# Patient Record
Sex: Male | Born: 1955 | Race: White | Hispanic: No | Marital: Married | Smoking: Current some day smoker
Health system: Southern US, Community
[De-identification: ages and names within clinical notes are randomized; demographics above are authoritative.]

## PROBLEM LIST (undated history)

## (undated) DIAGNOSIS — Z720 Tobacco use: Secondary | ICD-10-CM

## (undated) DIAGNOSIS — I739 Peripheral vascular disease, unspecified: Secondary | ICD-10-CM

## (undated) DIAGNOSIS — E785 Hyperlipidemia, unspecified: Secondary | ICD-10-CM

## (undated) DIAGNOSIS — E119 Type 2 diabetes mellitus without complications: Secondary | ICD-10-CM

## (undated) DIAGNOSIS — I10 Essential (primary) hypertension: Secondary | ICD-10-CM

## (undated) HISTORY — DX: Type 2 diabetes mellitus without complications: E11.9

## (undated) HISTORY — DX: Peripheral vascular disease, unspecified: I73.9

## (undated) HISTORY — DX: Essential (primary) hypertension: I10

## (undated) HISTORY — DX: Tobacco use: Z72.0

## (undated) HISTORY — DX: Hyperlipidemia, unspecified: E78.5

---

## 1998-05-23 ENCOUNTER — Encounter: Payer: Self-pay | Admitting: Emergency Medicine

## 1998-05-23 ENCOUNTER — Emergency Department (HOSPITAL_COMMUNITY): Admission: EM | Admit: 1998-05-23 | Discharge: 1998-05-23 | Payer: Self-pay | Admitting: Emergency Medicine

## 2000-08-13 ENCOUNTER — Emergency Department (HOSPITAL_COMMUNITY): Admission: EM | Admit: 2000-08-13 | Discharge: 2000-08-13 | Payer: Self-pay | Admitting: Emergency Medicine

## 2002-05-24 ENCOUNTER — Encounter: Admission: RE | Admit: 2002-05-24 | Discharge: 2002-05-24 | Payer: Self-pay | Admitting: Family Medicine

## 2002-08-24 ENCOUNTER — Emergency Department (HOSPITAL_COMMUNITY): Admission: EM | Admit: 2002-08-24 | Discharge: 2002-08-24 | Payer: Self-pay | Admitting: *Deleted

## 2002-09-12 ENCOUNTER — Emergency Department (HOSPITAL_COMMUNITY): Admission: EM | Admit: 2002-09-12 | Discharge: 2002-09-12 | Payer: Self-pay | Admitting: Emergency Medicine

## 2002-09-12 ENCOUNTER — Encounter: Payer: Self-pay | Admitting: Emergency Medicine

## 2002-10-08 ENCOUNTER — Encounter: Admission: RE | Admit: 2002-10-08 | Discharge: 2002-10-08 | Payer: Self-pay | Admitting: Orthopedic Surgery

## 2002-10-08 ENCOUNTER — Encounter: Payer: Self-pay | Admitting: Orthopedic Surgery

## 2006-05-15 DIAGNOSIS — E669 Obesity, unspecified: Secondary | ICD-10-CM

## 2006-05-15 DIAGNOSIS — F17200 Nicotine dependence, unspecified, uncomplicated: Secondary | ICD-10-CM | POA: Insufficient documentation

## 2006-05-19 ENCOUNTER — Encounter: Admission: RE | Admit: 2006-05-19 | Discharge: 2006-05-19 | Payer: Self-pay | Admitting: Nephrology

## 2008-05-02 ENCOUNTER — Emergency Department (HOSPITAL_COMMUNITY): Admission: EM | Admit: 2008-05-02 | Discharge: 2008-05-02 | Payer: Self-pay | Admitting: Emergency Medicine

## 2009-04-17 ENCOUNTER — Emergency Department (HOSPITAL_COMMUNITY): Admission: EM | Admit: 2009-04-17 | Discharge: 2009-04-17 | Payer: Self-pay | Admitting: Emergency Medicine

## 2009-04-19 ENCOUNTER — Emergency Department (HOSPITAL_COMMUNITY): Admission: EM | Admit: 2009-04-19 | Discharge: 2009-04-19 | Payer: Self-pay | Admitting: Emergency Medicine

## 2009-04-25 ENCOUNTER — Encounter: Admission: RE | Admit: 2009-04-25 | Discharge: 2009-04-25 | Payer: Self-pay | Admitting: Family Medicine

## 2009-10-19 ENCOUNTER — Encounter: Admission: RE | Admit: 2009-10-19 | Discharge: 2009-10-19 | Payer: Self-pay | Admitting: Internal Medicine

## 2010-04-07 ENCOUNTER — Other Ambulatory Visit: Payer: Self-pay | Admitting: Internal Medicine

## 2010-04-07 DIAGNOSIS — N289 Disorder of kidney and ureter, unspecified: Secondary | ICD-10-CM

## 2010-06-03 LAB — DIFFERENTIAL
Basophils Absolute: 0 10*3/uL (ref 0.0–0.1)
Basophils Relative: 0 % (ref 0–1)
Eosinophils Absolute: 0 10*3/uL (ref 0.0–0.7)
Neutro Abs: 7.1 10*3/uL (ref 1.7–7.7)
Neutrophils Relative %: 82 % — ABNORMAL HIGH (ref 43–77)

## 2010-06-03 LAB — COMPREHENSIVE METABOLIC PANEL
ALT: 18 U/L (ref 0–53)
Alkaline Phosphatase: 74 U/L (ref 39–117)
BUN: 14 mg/dL (ref 6–23)
CO2: 28 mEq/L (ref 19–32)
Chloride: 100 mEq/L (ref 96–112)
GFR calc non Af Amer: >60 mL/min (ref >60)
Glucose, Bld: 167 mg/dL — ABNORMAL HIGH (ref 70–99)
Potassium: 3.4 mEq/L — ABNORMAL LOW (ref 3.5–5.1)
Sodium: 135 mEq/L (ref 135–145)
Total Bilirubin: 0.8 mg/dL (ref 0.3–1.2)
Total Protein: 7.9 g/dL (ref 6.0–8.3)

## 2010-06-03 LAB — CBC
HCT: 39.6 % (ref 39.0–52.0)
Hemoglobin: 13.2 g/dL (ref 13.0–17.0)
RBC: 4.89 MIL/uL (ref 4.22–5.81)
RDW: 17.4 % — ABNORMAL HIGH (ref 11.5–15.5)

## 2010-06-03 LAB — LIPASE, BLOOD: Lipase: 24 U/L (ref 11–59)

## 2010-06-06 LAB — BASIC METABOLIC PANEL
BUN: 11 mg/dL (ref 6–23)
Calcium: 9 mg/dL (ref 8.4–10.5)
Chloride: 96 mEq/L (ref 96–112)
Creatinine, Ser: 1.05 mg/dL (ref 0.4–1.5)
GFR calc Af Amer: >60 mL/min (ref >60)

## 2010-06-06 LAB — CBC
MCHC: 33 g/dL (ref 30.0–36.0)
MCV: 80.2 fL (ref 78.0–100.0)
Platelets: 309 10*3/uL (ref 150–400)
RBC: 5.02 MIL/uL (ref 4.22–5.81)
WBC: 8.9 10*3/uL (ref 4.0–10.5)

## 2010-06-06 LAB — GLUCOSE, CAPILLARY: Glucose-Capillary: 146 mg/dL — ABNORMAL HIGH (ref 70–99)

## 2010-10-22 ENCOUNTER — Other Ambulatory Visit: Payer: Self-pay

## 2010-10-24 ENCOUNTER — Other Ambulatory Visit: Payer: Self-pay

## 2013-05-12 ENCOUNTER — Ambulatory Visit: Payer: BC Managed Care – PPO | Admitting: Podiatry

## 2013-05-12 ENCOUNTER — Encounter: Payer: Self-pay | Admitting: Podiatry

## 2013-05-12 ENCOUNTER — Ambulatory Visit (INDEPENDENT_AMBULATORY_CARE_PROVIDER_SITE_OTHER): Payer: BC Managed Care – PPO

## 2013-05-12 ENCOUNTER — Other Ambulatory Visit: Payer: Self-pay | Admitting: *Deleted

## 2013-05-12 VITALS — BP 170/96 | HR 66 | Temp 98.5°F | Resp 12

## 2013-05-12 DIAGNOSIS — R52 Pain, unspecified: Secondary | ICD-10-CM

## 2013-05-12 DIAGNOSIS — M779 Enthesopathy, unspecified: Secondary | ICD-10-CM

## 2013-05-12 DIAGNOSIS — L84 Corns and callosities: Secondary | ICD-10-CM

## 2013-05-12 DIAGNOSIS — R0989 Other specified symptoms and signs involving the circulatory and respiratory systems: Secondary | ICD-10-CM

## 2013-05-12 NOTE — Patient Instructions (Signed)
The vascular lab will contact you for your lower extremity arterial Doppler. I have advised to to stay off work until further notice.   Diabetes and Foot Care Diabetes may cause you to have problems because of poor blood supply (circulation) to your feet and legs. This may cause the skin on your feet to become thinner, break easier, and heal more slowly. Your skin may become dry, and the skin may peel and crack. You may also have nerve damage in your legs and feet causing decreased feeling in them. You may not notice minor injuries to your feet that could lead to infections or more serious problems. Taking care of your feet is one of the most important things you can do for yourself.  HOME CARE INSTRUCTIONS  Wear shoes at all times, even in the house. Do not go barefoot. Bare feet are easily injured.  Check your feet daily for blisters, cuts, and redness. If you cannot see the bottom of your feet, use a mirror or ask someone for help.  Wash your feet with warm water (do not use hot water) and mild soap. Then pat your feet and the areas between your toes until they are completely dry. Do not soak your feet as this can dry your skin.  Apply a moisturizing lotion or petroleum jelly (that does not contain alcohol and is unscented) to the skin on your feet and to dry, brittle toenails. Do not apply lotion between your toes.  Trim your toenails straight across. Do not dig under them or around the cuticle. File the edges of your nails with an emery board or nail file.  Do not cut corns or calluses or try to remove them with medicine.  Wear clean socks or stockings every day. Make sure they are not too tight. Do not wear knee-high stockings since they may decrease blood flow to your legs.  Wear shoes that fit properly and have enough cushioning. To break in new shoes, wear them for just a few hours a day. This prevents you from injuring your feet. Always look in your shoes before you put them on to  be sure there are no objects inside.  Do not cross your legs. This may decrease the blood flow to your feet.  If you find a minor scrape, cut, or break in the skin on your feet, keep it and the skin around it clean and dry. These areas may be cleansed with mild soap and water. Do not cleanse the area with peroxide, alcohol, or iodine.  When you remove an adhesive bandage, be sure not to damage the skin around it.  If you have a wound, look at it several times a day to make sure it is healing.  Do not use heating pads or hot water bottles. They may burn your skin. If you have lost feeling in your feet or legs, you may not know it is happening until it is too late.  Make sure your health care provider performs a complete foot exam at least annually or more often if you have foot problems. Report any cuts, sores, or bruises to your health care provider immediately. SEEK MEDICAL CARE IF:   You have an injury that is not healing.  You have cuts or breaks in the skin.  You have an ingrown nail.  You notice redness on your legs or feet.  You feel burning or tingling in your legs or feet.  You have pain or cramps in your legs  and feet.  Your legs or feet are numb.  Your feet always feel cold. SEEK IMMEDIATE MEDICAL CARE IF:   There is increasing redness, swelling, or pain in or around a wound.  There is a red line that goes up your leg.  Pus is coming from a wound.  You develop a fever or as directed by your health care provider.  You notice a bad smell coming from an ulcer or wound. Document Released: 03/01/2000 Document Revised: 11/04/2012 Document Reviewed: 08/11/2012 [facility] Patient Information 2014 [place].

## 2013-05-12 NOTE — Progress Notes (Signed)
   Subjective:    Patient ID: Travis Jensen, male    DOB: [birth year], 58 y.o.   MRN: [id number]  HPI '' BOTH BOTTOM OF THE FEET HAVE CALLUSES  AND THEY BEEN HURTING FOR 3 MONTHS. THE FEET HURTS MORE WHEN PUTTING PRESSURE AND WALKING. MY FEET ARE GETTING WORSE, BUT TRIED TO SOAK IT EPSON SALT AND WARM WATER ONCE A DAY BUT DOES NOT HELP.''  This patient presents for ongoing increasing plantar foot pain left more than right. He is limping when he walks. He is tried some home therapy without improvement of symptoms he works at [employer] and is having difficulty doing his job duties because of painful feet.  Dr. Renford Dillsonald Polite. referred him for evaluation of painful callus on his left foot.  He is a known diabetic and continues to smoke cigarettes.    Review of Systems  Constitutional: Positive for appetite change.  HENT: Positive for sneezing.   Genitourinary: Positive for frequency.  Musculoskeletal: Positive for gait problem.  All other systems reviewed and are negative.       Objective:   Physical Exam  Black male orientated x3 space presents with his wife  Vascular: The right DP 0/4. The left DP 2/4 The PTs are 0/4 bilaterally.  Neurological: Sensation to 10 g monofilament wire intact 10/10 bilaterally. Vibratory sensation intact bilaterally. The knee and ankle reflexes equal and reactive bilaterally. The toenails are elongated, hypertrophic and discolored x10. Plantar left foot demonstrates a fibrous, hemorrhagic callus from the second to fourth MPJ area (20 mm x 40 mm). Gentle debridement of this area reveals hyperkeratotic tissue without any underlying drainage.  Plantar third-fourth MPJ area has a 10 mm nonhemorrhagic,hyperkeratotic tissue,   Musculoskeletal: Low arch foot contour noted, bilaterally. Patient has a limping gait favoring the left foot. 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  X-ray report right foot  Intact bony structure without a  fracture and/or dislocation noted. Mild HAV deformity and hallux interphalangeal is noted. The first metatarsal length is a relatively long.A flatfoot is noted  Radiographic impression: No acute bony abnormality noted in the right foot  X-ray examination weightbearing left foot  A flatfoot is noted. Hallux interphalangeal is noted. There is an x-ray artifact in the distal one third of the fibula.  No fracture or and or dislocations noted.  No acute bony abnormality noted left foot      Assessment & Plan:   Assessment: Rule out peripheral arterial disease because of diminished pedal pulses bilaterally Pre-ulcerative hyperkeratotic lesion plantar left with extreme pain associated with this area Painful hyperkeratoses plantar right Painful gait pattern  Plan: Patient is referred for vascular examination lower extremity arterial Doppler for the indication of diminished pedal pulses and diabetes.  A half-inch Plastizote is placed in a surgical shoe to be worn on the left foot. The plantar keratoses right and left were debrided back without a bleeding Patient will stay out of work until further notice  Reappoint x7 days

## 2013-05-14 ENCOUNTER — Telehealth (HOSPITAL_COMMUNITY): Payer: Self-pay | Admitting: *Deleted

## 2013-05-17 ENCOUNTER — Ambulatory Visit (HOSPITAL_COMMUNITY)
Admission: RE | Admit: 2013-05-17 | Discharge: 2013-05-17 | Disposition: A | Discharge status: Home / Self Care | Payer: BC Managed Care – PPO | Source: Ambulatory Visit | Attending: Internal Medicine | Admitting: Internal Medicine

## 2013-05-17 DIAGNOSIS — I739 Peripheral vascular disease, unspecified: Secondary | ICD-10-CM

## 2013-05-17 DIAGNOSIS — I70219 Atherosclerosis of native arteries of extremities with intermittent claudication, unspecified extremity: Secondary | ICD-10-CM

## 2013-05-17 DIAGNOSIS — I998 Other disorder of circulatory system: Secondary | ICD-10-CM | POA: Insufficient documentation

## 2013-05-17 DIAGNOSIS — R0989 Other specified symptoms and signs involving the circulatory and respiratory systems: Secondary | ICD-10-CM

## 2013-05-17 DIAGNOSIS — L97509 Non-pressure chronic ulcer of other part of unspecified foot with unspecified severity: Secondary | ICD-10-CM

## 2013-05-17 NOTE — Progress Notes (Signed)
Arterial Lower Ext. Duplex Completed. Antiono Ettinger, BS, RDMS, RVT  

## 2013-05-19 ENCOUNTER — Ambulatory Visit: Payer: BC Managed Care – PPO | Admitting: Podiatry

## 2013-05-19 ENCOUNTER — Encounter: Payer: Self-pay | Admitting: Podiatry

## 2013-05-19 VITALS — BP 147/77 | HR 94 | Resp 12

## 2013-05-19 DIAGNOSIS — L97509 Non-pressure chronic ulcer of other part of unspecified foot with unspecified severity: Secondary | ICD-10-CM

## 2013-05-19 NOTE — Patient Instructions (Signed)
Apply triple antibiotic ointment to the skin on the bottom the left foot daily, cover with gauze and attach with Coflex tape. Continue to walk in surgical shoe on  a limited basis.

## 2013-05-20 ENCOUNTER — Telehealth: Payer: Self-pay | Admitting: *Deleted

## 2013-05-20 NOTE — Telephone Encounter (Addendum)
Pt left only his name and phone number.  I called and left message to call again.  I called again at 1109am.  I spoke with pt, he states Dr Leeanne Deeduchman told him to put Triple Antibiotic ointment on his foot where the callous was trimmed, but can he take a shower first?  I told pt to take the shower and cleanse the area as normal and cover with the Triple Antibiotic ointment. Pt states understanding.

## 2013-05-20 NOTE — Progress Notes (Signed)
Patient ID: Travis Jensen, male   DOB: [birth year], 58 y.o.   MRN: [id number]  Subjective: This patient presents for followup care for a painful pre-ulcerative hyperkeratotic lesion plantar aspect left foot under our care since 05/12/2013. He is still having great deal of pain in the plantar aspect of the left foot when walking. He has had a recent arterial Doppler, however the results are not known at this time. Patient is off of work.  Objective: Anti-inflammatory large hyperkeratotic area on the plantar aspect of the left foot MPJ area measures 3.5 cm x 3 cm. After debridement the lesion breakdown into the superficial ulcer without any drainage, malodor, warmth noted.  Nucleated plantar keratoses right noted.  Assessment: Superficial ulceration plantar aspect of left foot, noninfected. Plantar keratoses right.  Pending arterial Doppler  Plan: The plantar ulcer on the left was debrided back and a Silvadene dressing applied. Plantar keratoses right was debrided. Patient was instructed to apply triple antibiotic ointment to the plantar skin of the left foot and cover with gauze, and continue wearing his surgical shoe. Maintain off work status  Reappoint x7 days

## 2013-05-20 NOTE — Telephone Encounter (Signed)
Pt's dopplers of 030/04/2013 were abnormal.  I made an appt for pt with Dr Allyson SabalBerry on 05/27/2013 at 130pm.  I will inform Dr Leeanne Deeduchman.

## 2013-05-25 ENCOUNTER — Encounter: Payer: Self-pay | Admitting: Cardiovascular Disease

## 2013-05-25 ENCOUNTER — Ambulatory Visit (INDEPENDENT_AMBULATORY_CARE_PROVIDER_SITE_OTHER): Payer: BC Managed Care – PPO | Admitting: Cardiovascular Disease

## 2013-05-25 VITALS — BP 132/86 | HR 68 | Ht 73.5 in | Wt 239.3 lb

## 2013-05-25 DIAGNOSIS — E785 Hyperlipidemia, unspecified: Secondary | ICD-10-CM

## 2013-05-25 DIAGNOSIS — E119 Type 2 diabetes mellitus without complications: Secondary | ICD-10-CM | POA: Insufficient documentation

## 2013-05-25 DIAGNOSIS — I739 Peripheral vascular disease, unspecified: Secondary | ICD-10-CM | POA: Insufficient documentation

## 2013-05-25 DIAGNOSIS — I10 Essential (primary) hypertension: Secondary | ICD-10-CM | POA: Insufficient documentation

## 2013-05-25 NOTE — Assessment & Plan Note (Signed)
Controlled on current medications 

## 2013-05-25 NOTE — Progress Notes (Signed)
05/25/2013 Travis StarchLarry D Clermont   05/11/1955  [id number]  Primary Physician Katy ApoPOLITE,Travis D, MD Primary Cardiologist: Runell GessJonathan J. Berry MD Travis RenoFACP,FACC,FAHA, FSCAI   HPI:  [name] is a very pleasant 58 year old moderately overweight married African American male father of 5 children, grandfather of 9 grandchildren who is referred by Dr. Santiago Bumpersichard Tuchman from [facility] for peripheral vascular evaluation because of a slowly healing callus that was shaved on the dorsum of his left foot. His cardiovascular risk factor profile is remarkable for treated diabetes and hyperlipidemia. He quit smoking one month ago and smoked 20 pack years. His mother did die of a myocardial infarction at age 56. He has never had a heart attack or stroke and denies chest pain or shortness of breath. He also denies claudication he had Dopplers in the office that showed ABIs of approximately 0.8 with tibial vessel disease left greater than right. His callus that was shaved by Dr. Leeanne Deeduchman appears to be healing well.   Current Outpatient Prescriptions  Medication Sig Dispense Refill  . atorvastatin (LIPITOR) 10 MG tablet Take 10 mg by mouth daily.       Marland Kitchen. glyBURIDE (DIABETA) 5 MG tablet Take 5 mg by mouth daily.       . metFORMIN (GLUCOPHAGE) 500 MG tablet Take 1,000 mg by mouth 2 (two) times daily with a meal.        No current facility-administered medications for this visit.    No Known Allergies  History   Social History  . Marital Status: Married    Spouse Name: N/A    Number of Children: N/A  . Years of Education: N/A   Occupational History  . Not on file.   Social History Main Topics  . Smoking status: Former Smoker    Quit date: 04/27/2013  . Smokeless tobacco: Not on file  . Alcohol Use: No  . Drug Use: No  . Sexual Activity: Not on file   Other Topics Concern  . Not on file   Social History Narrative  . No narrative on file     Review of Systems: General: negative for chills, fever, night  sweats or weight changes.  Cardiovascular: negative for chest pain, dyspnea on exertion, edema, orthopnea, palpitations, paroxysmal nocturnal dyspnea or shortness of breath Dermatological: negative for rash Respiratory: negative for cough or wheezing Urologic: negative for hematuria Abdominal: negative for nausea, vomiting, diarrhea, bright red blood per rectum, melena, or hematemesis Neurologic: negative for visual changes, syncope, or dizziness All other systems reviewed and are otherwise negative except as noted above.    Blood pressure 132/86, pulse 68, height 6' 1.5" (1.867 m), weight 108.546 kg (239 lb 4.8 oz).  General appearance: alert and no distress Neck: no adenopathy, no carotid bruit, no JVD, supple, symmetrical, trachea midline and thyroid not enlarged, symmetric, no tenderness/mass/nodules Lungs: clear to auscultation bilaterally Heart: regular rate and rhythm, S1, S2 normal, no murmur, click, rub or gallop Abdomen: soft, non-tender; bowel sounds normal; no masses,  no organomegaly Extremities: extremities normal, atraumatic, no cyanosis or edema and aabsent pedal pulses  EKG not performed today  ASSESSMENT AND PLAN:   Peripheral arterial disease Patient was referred by Dr. Santiago Bumpersichard Tuchman [facility] but sent her for peripheral vascular evaluation because of a callous  that was shaved on the dorsum of his left foot. His cardiovascular risk factor profile is remarkable for discontinued tobacco use, hypertension, hyperlipidemia and diabetes. He denies claudication. His lower extremity arterial Doppler studies performed in  our office 05/17/13 revealed ABIs of 0.8 bilaterally with ABIs of 0.4 on the right and 0.6 on the left. He had an occluded dorsalis pedis on the right and a patent peroneal on the left. At this point, given the fact that his talus is healing and he has no symptoms of claudication I am going to continue to follow him medically. His disease is primarily tibial,  typical for diabetics.  Essential hypertension Controlled on current medications  Hyperlipidemia On statin therapy followed by his PCP      Runell Gess MD [facility], [facility] [address] 05/25/2013 8:48 AM

## 2013-05-25 NOTE — Assessment & Plan Note (Signed)
Patient was referred by Dr. Santiago Bumpersichard Tuchman from [facility] but sent her for peripheral vascular evaluation because of a callous  that was shaved on the dorsum of his left foot. His cardiovascular risk factor profile is remarkable for discontinued tobacco use, hypertension, hyperlipidemia and diabetes. He denies claudication. His lower extremity arterial Doppler studies performed in our office 05/17/13 revealed ABIs of 0.8 bilaterally with ABIs of 0.4 on the right and 0.6 on the left. He had an occluded dorsalis pedis on the right and a patent peroneal on the left. At this point, given the fact that his talus is healing and he has no symptoms of claudication I am going to continue to follow him medically. His disease is primarily tibial, typical for diabetics.

## 2013-05-25 NOTE — Assessment & Plan Note (Signed)
On statin therapy followed by his PCP 

## 2013-05-25 NOTE — Patient Instructions (Signed)
Your physician wants you to follow-up in: 6 months with Dr Berry. You will receive a reminder letter in the mail two months in advance. If you don't receive a letter, please call our office to schedule the follow-up appointment.  

## 2013-05-27 ENCOUNTER — Ambulatory Visit: Payer: BC Managed Care – PPO | Admitting: Cardiovascular Disease

## 2013-05-31 ENCOUNTER — Ambulatory Visit (INDEPENDENT_AMBULATORY_CARE_PROVIDER_SITE_OTHER): Payer: BC Managed Care – PPO | Admitting: Podiatry

## 2013-05-31 ENCOUNTER — Encounter: Payer: Self-pay | Admitting: Podiatry

## 2013-05-31 VITALS — BP 125/79 | HR 76 | Temp 98.2°F | Resp 18

## 2013-05-31 DIAGNOSIS — M79609 Pain in unspecified limb: Secondary | ICD-10-CM

## 2013-05-31 DIAGNOSIS — B351 Tinea unguium: Secondary | ICD-10-CM

## 2013-05-31 NOTE — Patient Instructions (Signed)
Wear the surgical shoe on the left foot until you return to work on 06/07/2013. Apply Vaseline it and/or skin lotion daily to the right and left feet.

## 2013-06-01 NOTE — Progress Notes (Signed)
Patient ID: Travis Jensen, male   DOB: [birth year], 58 y.o.   MRN: [id number]   Subjective: This patient presents for followup care for a painful pre-ulcerative hyperkeratotic lesion on the plantar aspect the left foot under care since 05/12/2013. He has been off work since then. An arterial Doppler has been performed and followed up with Dr. Allyson SabalBerry for peripheral arterial disease.  He also is complaining of painful toenails and request debridement.  Objective: The plantar aspect the left foot has some residual hyperkeratotic tissue without any bleeding callus noted in the area. There is no erythema or edema noted in the area.  All 10 toenails are elongated, hypertrophic, discolored and tender to palpation  Assessment: Improving plantar hyperkeratoses with reducing symptoms on the left foot Symptomatic onychomycoses x10 Peripheral arterial disease being followed by cardiologist  Plan: Will allow patient returned to work regular duty I 06/07/2013. He will continue to wear the modified surgical shoe and left foot until he returns to work. He works she will be a good lace up shoe with a soft insole.  All 10 toenails are debrided without any bleeding. Reappoint x4 weeks

## 2013-06-16 ENCOUNTER — Encounter: Payer: Self-pay | Admitting: *Deleted

## 2013-06-16 ENCOUNTER — Encounter: Payer: BC Managed Care – PPO | Attending: Internal Medicine | Admitting: *Deleted

## 2013-06-16 VITALS — Ht 73.0 in | Wt 234.1 lb

## 2013-06-16 DIAGNOSIS — E119 Type 2 diabetes mellitus without complications: Secondary | ICD-10-CM | POA: Insufficient documentation

## 2013-06-16 DIAGNOSIS — Z713 Dietary counseling and surveillance: Secondary | ICD-10-CM | POA: Insufficient documentation

## 2013-06-16 NOTE — Patient Instructions (Signed)
Plan:  Aim for 3-4 Carb servings per meal (45-60 grams) +/- 1 either way  Aim for 0-15 Carbs per snack if hungry  Consider reading food labels for Total Carbohydrate and Fat Grams of foods Consider  increasing your activity level by walking for 30 minutes daily as tolerated . Baby steps Consider checking BG at alternate times per day Fasting and 2 hours after your meal as directed by MD  Continue taking medication  as directed by MD  Consider [facility] Protein Bars Consider margarine substitute ... Brummel and Brown Consider reduced calorie bread Try to limit to 5 egg yolks a week

## 2013-06-16 NOTE — Progress Notes (Signed)
Appt start time: 1500 end time:  1630.   Assessment:  Patient was seen on  06/16/13 for individual diabetes education. He presents with his wife.  [name] was diagnosed with T2DM approximately 10 years ago but did not receive any education. He Was was on Metformin and discontinued for a few years. His glucose then became out of control . He has since seen Dr. Nehemiah SettlePolite, is back on the metformin and has made some dietary modifications.Patient test glucose 4X daily FBS range 104-197mg /dl. Avg 139mg /dl.  Current HbA1c: 12.4%   04/2013  Preferred Learning Style:   No preference indicated   Learning Readiness:   Change in progress  MEDICATIONS: See list: Metformin   DIETARY INTAKE:  B ( AM): 1/2 C grits, 2 egg, water Snk ( AM): none  L ( PM): baked chicken, brown rice, broccoli, carrots , water Snk ( PM): none D ( PM): salad, (spinache, kale, tomatoes, grapes, cheese, cucumbers, raisins, ) ranch dressing Snk ( PM): none Beverages: water, coffee/splenda  Usual physical activity: None, has treadmill and stationary bicycle at home  Intervention:  Nutrition counseling provided.  Discussed diabetes disease process and treatment options.  Discussed physiology of diabetes and role of obesity on insulin resistance.  Encouraged moderate weight reduction to improve glucose levels.  Discussed role of medications and diet in glucose control  Provided education on macronutrients on glucose levels.  Provided education on carb counting, importance of regularly scheduled meals/snacks, and meal planning  Discussed effects of physical activity on glucose levels and long-term glucose control.  Recommended 150 minutes of physical activity/week.  Reviewed patient medications.  Discussed role of medication on blood glucose and possible side effects  Discussed blood glucose monitoring and interpretation.  Discussed recommended target ranges and individual ranges.    Described short-term complications: hyper-  and hypo-glycemia.  Discussed causes,symptoms, and treatment options.  Discussed prevention, detection, and treatment of long-term complications.  Discussed the role of prolonged elevated glucose levels on body systems.  Discussed role of stress on blood glucose levels and discussed strategies to manage psychosocial issues.  Discussed recommendations for long-term diabetes self-care.  Established checklist for medical, dental, and emotional self-care.  Plan:  Aim for 3-4 Carb servings per meal (45-60 grams) +/- 1 either way  Aim for 0-15 Carbs per snack if hungry  Consider reading food labels for Total Carbohydrate and Fat Grams of foods Consider  increasing your activity level by walking for 30 minutes daily as tolerated . Baby steps Consider checking BG at alternate times per day Fasting and 2 hours after your meal as directed by MD  Continue taking medication  as directed by MD  Consider [facility] Protein Bars Consider margarine substitute ... Brummel and [employer] Consider reduced calorie bread Try to limit to 5 egg yolks a week  Teaching Method Utilized:  Visual Auditory Hands on  Handouts given during visit include: Living Well with Diabetes Carb Counting and Food Label handouts Meal Plan Card My Plate  Snack sheet  Barriers to learning/adherence to lifestyle change: None  Diabetes self-care support plan:   [facility] support group  Family  Demonstrated degree of understanding via:  Teach Back   Monitoring/Evaluation:  Dietary intake, exercise, test glucose, and body weight return for f/u prn.

## 2013-06-30 ENCOUNTER — Ambulatory Visit (INDEPENDENT_AMBULATORY_CARE_PROVIDER_SITE_OTHER): Payer: BC Managed Care – PPO | Admitting: Podiatry

## 2013-06-30 ENCOUNTER — Encounter: Payer: Self-pay | Admitting: Podiatry

## 2013-06-30 VITALS — BP 205/121 | HR 76 | Temp 98.4°F | Resp 18 | Ht 73.5 in | Wt 234.0 lb

## 2013-06-30 DIAGNOSIS — L84 Corns and callosities: Secondary | ICD-10-CM

## 2013-06-30 NOTE — Patient Instructions (Signed)
Wear over-the-counter soft insole inside a new lace up style shoe

## 2013-07-01 NOTE — Progress Notes (Signed)
Patient ID: Travis Jensen, male   DOB: January 08, 1956, 58 y.o.   MRN: [id number] Subjective: This patient presents for followup care for painful pre-ulcerative hyperkeratotic lesion plantar left foot under care since 05/12/2013. He has been evaluated for peripheral arterial disease and is being followed by Dr. Gery PrayBarry for this. He has returned to his job at [employer].  Objective: Large diffuse hyperkeratotic lesion plantar left from second to fourth MPJ area with reactive fibrous tissue around keratoses.  Assessment: Pre-ulcerative hyperkeratotic lesion plantar left Peripheral arterial disease  Plan: The plantar Keratoses is debrided. Patient is advised to wear a soft over-the-counter insole in a new shoe. 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  Reappoint x4 weeks

## 2013-07-28 ENCOUNTER — Ambulatory Visit (INDEPENDENT_AMBULATORY_CARE_PROVIDER_SITE_OTHER): Payer: BC Managed Care – PPO | Admitting: Podiatry

## 2013-07-28 ENCOUNTER — Encounter: Payer: Self-pay | Admitting: Podiatry

## 2013-07-28 VITALS — BP 181/96 | HR 82 | Temp 98.1°F | Resp 18 | Ht 73.5 in | Wt 233.0 lb

## 2013-07-28 DIAGNOSIS — L84 Corns and callosities: Secondary | ICD-10-CM

## 2013-07-29 NOTE — Progress Notes (Signed)
Patient ID: Travis Jensen, male   DOB: 12/29/1955, 58 y.o.   MRN: [id number]  Subjective: This patient presents for followup care for painful pre-ulcerative hyperkeratotic lesion under care since 05/12/2013. Has been evaluated for peripheral arterial disease by Dr. York RamJonathan Barry and he will continue followup care by Dr. Gery PrayBarry. 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  Objective: Large diffuse hyperkeratotic lesion plantar second to fourth MPJ that after debridement remains closed. Area demonstrates fibrous reactive tissue without any hemorrhagic callus noted  Assessment: Hyperkeratotic tissue plantar left foot  Plan: Debridement of hyperkeratotic tissue Attach felt pad to patient's existing shoe insole to offload the weight off the plantar second and fourth MPJ area on the left foot.  Reappoint x6 weeks

## 2013-08-19 ENCOUNTER — Other Ambulatory Visit (HOSPITAL_COMMUNITY): Payer: Self-pay | Admitting: Internal Medicine

## 2013-08-19 ENCOUNTER — Other Ambulatory Visit: Payer: Self-pay | Admitting: Internal Medicine

## 2013-08-19 ENCOUNTER — Ambulatory Visit
Admission: RE | Admit: 2013-08-19 | Discharge: 2013-08-19 | Disposition: A | Discharge status: Home / Self Care | Payer: BC Managed Care – PPO | Source: Ambulatory Visit | Attending: Internal Medicine | Admitting: Internal Medicine

## 2013-08-19 DIAGNOSIS — R112 Nausea with vomiting, unspecified: Secondary | ICD-10-CM

## 2013-08-20 ENCOUNTER — Ambulatory Visit (HOSPITAL_COMMUNITY)
Admission: RE | Admit: 2013-08-20 | Discharge: 2013-08-20 | Disposition: A | Discharge status: Home / Self Care | Payer: BC Managed Care – PPO | Source: Ambulatory Visit | Attending: Internal Medicine | Admitting: Internal Medicine

## 2013-08-20 DIAGNOSIS — R109 Unspecified abdominal pain: Secondary | ICD-10-CM | POA: Insufficient documentation

## 2013-08-20 DIAGNOSIS — R52 Pain, unspecified: Secondary | ICD-10-CM | POA: Insufficient documentation

## 2013-08-20 DIAGNOSIS — R112 Nausea with vomiting, unspecified: Secondary | ICD-10-CM | POA: Insufficient documentation

## 2013-08-30 ENCOUNTER — Other Ambulatory Visit: Payer: Self-pay | Admitting: Internal Medicine

## 2013-08-30 DIAGNOSIS — N289 Disorder of kidney and ureter, unspecified: Secondary | ICD-10-CM

## 2013-09-05 ENCOUNTER — Ambulatory Visit
Admission: RE | Admit: 2013-09-05 | Discharge: 2013-09-05 | Disposition: A | Discharge status: Home / Self Care | Payer: BC Managed Care – PPO | Source: Ambulatory Visit | Attending: Internal Medicine | Admitting: Internal Medicine

## 2013-09-05 DIAGNOSIS — N289 Disorder of kidney and ureter, unspecified: Secondary | ICD-10-CM

## 2013-09-05 MED ORDER — GADOBENATE DIMEGLUMINE 529 MG/ML IV SOLN
10.0000 mL | Freq: Once | INTRAVENOUS | Status: AC | PRN
  Administered 2013-09-05: 10 mL via INTRAVENOUS

## 2013-09-08 ENCOUNTER — Ambulatory Visit (INDEPENDENT_AMBULATORY_CARE_PROVIDER_SITE_OTHER): Payer: BC Managed Care – PPO | Admitting: Podiatry

## 2013-09-08 ENCOUNTER — Ambulatory Visit: Payer: BC Managed Care – PPO | Admitting: Podiatry

## 2013-09-08 ENCOUNTER — Encounter: Payer: Self-pay | Admitting: Podiatry

## 2013-09-08 VITALS — BP 135/86 | HR 68 | Temp 98.0°F | Resp 16 | Ht 73.5 in | Wt 226.0 lb

## 2013-09-08 DIAGNOSIS — L84 Corns and callosities: Secondary | ICD-10-CM

## 2013-09-08 NOTE — Progress Notes (Signed)
Patient ID: Travis Jensen, male   DOB: [birth year], 58 y.o.   MRN: [id number]  Subjective: This patient presents for followup care for pre-ulcerative hyperkeratotic lesion under care since every 25th 2015. Patient has been evaluated for peripheral arterial disease by Dr. Nanetta BattyJonathan Berry and he will continue followup with Dr. Allyson SabalBerry.  Objective: Diffuse hemorrhagic keratoses plantar second to fourth MPJ left and after debridement radiate remains closed. Hyperkeratotic tissue plantar right without bleeding  Assessment: Pre-ulcerative hyperkeratotic tissue left chronic Hyperkeratotic tissue right  Plan: Debridement of keratoses both right and left feet.  Reappoint x6 weeks for  debridement

## 2013-09-20 ENCOUNTER — Ambulatory Visit: Payer: BC Managed Care – PPO | Admitting: Podiatry

## 2013-10-20 ENCOUNTER — Ambulatory Visit (INDEPENDENT_AMBULATORY_CARE_PROVIDER_SITE_OTHER): Payer: BC Managed Care – PPO | Admitting: Podiatry

## 2013-10-20 ENCOUNTER — Encounter: Payer: Self-pay | Admitting: Podiatry

## 2013-10-20 VITALS — BP 174/86 | HR 72 | Resp 12

## 2013-10-20 DIAGNOSIS — L84 Corns and callosities: Secondary | ICD-10-CM

## 2013-10-20 DIAGNOSIS — I739 Peripheral vascular disease, unspecified: Secondary | ICD-10-CM

## 2013-10-21 NOTE — Progress Notes (Signed)
Patient ID: Luvenia StarchLarry D Badger, male   DOB: Mar 10, 1956, 58 y.o.   MRN: [id number]  Subjective: This patient presents for followup care for repetitive debridement of pre-ulcerative hyperkeratotic lesion plantar left and a newer lesion plantar right. He is complaining of painful plantar callus left and is requesting more of frequent debridement. Peripheral arterial disease is under the management of Dr. Nanetta BattyJonathan Berry.  Objective: Diffuse hemorrhagic keratoses plantar second the fourth MPJ and remains closed after debridement Plantar keratoses right Toenails are elongated, hypertrophic, incurvated 6-10  Assessment: Pre-ulcerative hyperkeratotic lesion left Plantar keratoses right Peripheral arterial disease bilaterally Diabetes  Plan: Keratoses right and left were debrided.  Reappoint x4 weeks for debridement of keratoses and nails at next visit

## 2013-11-17 ENCOUNTER — Encounter: Payer: Self-pay | Admitting: Podiatry

## 2013-11-17 ENCOUNTER — Ambulatory Visit (INDEPENDENT_AMBULATORY_CARE_PROVIDER_SITE_OTHER): Payer: BC Managed Care – PPO | Admitting: Podiatry

## 2013-11-17 VITALS — BP 154/90 | HR 87 | Resp 15

## 2013-11-17 DIAGNOSIS — I739 Peripheral vascular disease, unspecified: Secondary | ICD-10-CM

## 2013-11-17 DIAGNOSIS — B351 Tinea unguium: Secondary | ICD-10-CM

## 2013-11-17 DIAGNOSIS — M79676 Pain in unspecified toe(s): Secondary | ICD-10-CM

## 2013-11-17 DIAGNOSIS — M79609 Pain in unspecified limb: Secondary | ICD-10-CM

## 2013-11-17 DIAGNOSIS — L84 Corns and callosities: Secondary | ICD-10-CM

## 2013-11-18 NOTE — Progress Notes (Signed)
Patient ID: Travis Jensen, male   DOB: 08-25-1955, 58 y.o.   MRN: [id number]  Subjective: This patient presents for ongoing debridement of pre-ulcerative hyperkeratotic tissue and is also complaining of painful toenails and requesting debridement of toenails today as well.  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

## 2013-12-15 ENCOUNTER — Ambulatory Visit (INDEPENDENT_AMBULATORY_CARE_PROVIDER_SITE_OTHER): Payer: BC Managed Care – PPO | Admitting: Podiatry

## 2013-12-15 ENCOUNTER — Encounter: Payer: Self-pay | Admitting: Podiatry

## 2013-12-15 VITALS — BP 119/74 | HR 90 | Resp 12

## 2013-12-15 DIAGNOSIS — I739 Peripheral vascular disease, unspecified: Secondary | ICD-10-CM

## 2013-12-15 DIAGNOSIS — L84 Corns and callosities: Secondary | ICD-10-CM

## 2013-12-16 NOTE — Progress Notes (Signed)
Patient ID: Travis StarchLarry D Villasenor, male   DOB: 11-12-1955, 58 y.o.   MRN: [id number] Subjective: This patient presents for ongoing debridement of pre-ulcerative Hyperkeratotic tissue plantar left associated with peripheral arterial disease and diabetes. 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  Objective: Hemorrhagic callus plantar left MPJ that remains closed after debridement  Assessment:  pre-ulcerative plantar callus left Diabetes with peripheral arterial disease Dr. Allyson SabalBerry is managing peripheral arterial disease  Plan: Plantar calluses debrided A metatarsal raise was attached to the soft insole patient's shoe on the left foot  Reappoint x1 month at patient's request

## 2014-01-12 ENCOUNTER — Encounter: Payer: Self-pay | Admitting: Podiatry

## 2014-01-12 ENCOUNTER — Ambulatory Visit (INDEPENDENT_AMBULATORY_CARE_PROVIDER_SITE_OTHER): Payer: BC Managed Care – PPO | Admitting: Podiatry

## 2014-01-12 VITALS — BP 128/78 | HR 60 | Resp 12

## 2014-01-12 DIAGNOSIS — I739 Peripheral vascular disease, unspecified: Secondary | ICD-10-CM

## 2014-01-12 DIAGNOSIS — L84 Corns and callosities: Secondary | ICD-10-CM

## 2014-01-12 NOTE — Progress Notes (Signed)
Patient ID: Travis StarchLarry D Jensen, male   DOB: 05-22-1955, 58 y.o.   MRN: [id number]  Subjective: This patient presents for ongoing debridement of pre-ulcerative hyperkeratoses plantar left.  He continues to work at [employer] requiring continuous standing and walking  Objective: Orientated 3 Large hemorrhagic plantar keratoses second to fourth MPJ left and remains closed after debridement  Assessment: Pre-ulcerative hyperkeratoses plantar left He is a type II diabetic with peripheral arterial diseasePeripheral arterial disease under management of Dr.Berry  Plan: Debridement plantar keratoses left Attach surgical felt pad with cut out to offload the second to fourth MPJ and placed on shoe insole, left  Report 30 days

## 2014-02-09 ENCOUNTER — Ambulatory Visit: Payer: BC Managed Care – PPO | Admitting: Podiatry

## 2014-04-13 ENCOUNTER — Ambulatory Visit (INDEPENDENT_AMBULATORY_CARE_PROVIDER_SITE_OTHER): Payer: BLUE CROSS/BLUE SHIELD | Admitting: Podiatry

## 2014-04-13 DIAGNOSIS — M79676 Pain in unspecified toe(s): Secondary | ICD-10-CM

## 2014-04-13 DIAGNOSIS — B351 Tinea unguium: Secondary | ICD-10-CM

## 2014-04-13 DIAGNOSIS — L84 Corns and callosities: Secondary | ICD-10-CM

## 2014-04-13 DIAGNOSIS — I739 Peripheral vascular disease, unspecified: Secondary | ICD-10-CM

## 2014-04-13 NOTE — Progress Notes (Signed)
Patient ID: Travis Jensen, male   DOB: 1955/09/17, 59 y.o.   MRN: [id number]  Subjective: This patient presents today for ongoing debridement of pre-ulcerative plantar keratoses left and complaining of painful toenails right and left Has a weightbearing retail job  Objective: Large hemorrhagic hyperkeratoses second to fourth MPJ left that remains closed after debridement The toenails are elongated, incurvated discolored 6-10  Assessment: Type II diabetic with a history of peripheral arterial disease Onychomycoses 6-10 Pre- ulcerative callus plantar left  Plan: Debridement toenails 10 and keratoses 1 without a bleeding  Reappoint at 6 six-week intervals

## 2014-04-13 NOTE — Patient Instructions (Signed)
Diabetes and Foot Care Diabetes may cause you to have problems because of poor blood supply (circulation) to your feet and legs. This may cause the skin on your feet to become thinner, break easier, and heal more slowly. Your skin may become dry, and the skin may peel and crack. You may also have nerve damage in your legs and feet causing decreased feeling in them. You may not notice minor injuries to your feet that could lead to infections or more serious problems. Taking care of your feet is one of the most important things you can do for yourself.  HOME CARE INSTRUCTIONS  Wear shoes at all times, even in the house. Do not go barefoot. Bare feet are easily injured.  Check your feet daily for blisters, cuts, and redness. If you cannot see the bottom of your feet, use a mirror or ask someone for help.  Wash your feet with warm water (do not use hot water) and mild soap. Then pat your feet and the areas between your toes until they are completely dry. Do not soak your feet as this can dry your skin.  Apply a moisturizing lotion or petroleum jelly (that does not contain alcohol and is unscented) to the skin on your feet and to dry, brittle toenails. Do not apply lotion between your toes.  Trim your toenails straight across. Do not dig under them or around the cuticle. File the edges of your nails with an emery board or nail file.  Do not cut corns or calluses or try to remove them with medicine.  Wear clean socks or stockings every day. Make sure they are not too tight. Do not wear knee-high stockings since they may decrease blood flow to your legs.  Wear shoes that fit properly and have enough cushioning. To break in new shoes, wear them for just a few hours a day. This prevents you from injuring your feet. Always look in your shoes before you put them on to be sure there are no objects inside.  Do not cross your legs. This may decrease the blood flow to your feet.  If you find a minor scrape,  cut, or break in the skin on your feet, keep it and the skin around it clean and dry. These areas may be cleansed with mild soap and water. Do not cleanse the area with peroxide, alcohol, or iodine.  When you remove an adhesive bandage, be sure not to damage the skin around it.  If you have a wound, look at it several times a day to make sure it is healing.  Do not use heating pads or hot water bottles. They may burn your skin. If you have lost feeling in your feet or legs, you may not know it is happening until it is too late.  Make sure your health care provider performs a complete foot exam at least annually or more often if you have foot problems. Report any cuts, sores, or bruises to your health care provider immediately. SEEK MEDICAL CARE IF:   You have an injury that is not healing.  You have cuts or breaks in the skin.  You have an ingrown nail.  You notice redness on your legs or feet.  You feel burning or tingling in your legs or feet.  You have pain or cramps in your legs and feet.  Your legs or feet are numb.  Your feet always feel cold. SEEK IMMEDIATE MEDICAL CARE IF:   There is increasing redness,   swelling, or pain in or around a wound.  There is a red line that goes up your leg.  Pus is coming from a wound.  You develop a fever or as directed by your health care provider.  You notice a bad smell coming from an ulcer or wound. Document Released: 03/01/2000 Document Revised: 11/04/2012 Document Reviewed: 08/11/2012 [employer] Patient Information 2015 [employer]. This information is not intended to replace advice given to you by your health care provider. Make sure you discuss any questions you have with your health care provider.  

## 2014-05-25 ENCOUNTER — Ambulatory Visit: Payer: BLUE CROSS/BLUE SHIELD | Admitting: Podiatry

## 2014-06-01 ENCOUNTER — Ambulatory Visit (INDEPENDENT_AMBULATORY_CARE_PROVIDER_SITE_OTHER): Payer: BLUE CROSS/BLUE SHIELD | Admitting: Podiatry

## 2014-06-01 ENCOUNTER — Encounter: Payer: Self-pay | Admitting: Podiatry

## 2014-06-01 VITALS — BP 178/92 | HR 64 | Resp 12

## 2014-06-01 DIAGNOSIS — I739 Peripheral vascular disease, unspecified: Secondary | ICD-10-CM | POA: Diagnosis not present

## 2014-06-01 DIAGNOSIS — L84 Corns and callosities: Secondary | ICD-10-CM

## 2014-06-02 NOTE — Progress Notes (Signed)
Patient ID: Travis StarchLarry D Jensen, male   DOB: April 05, 1955, 59 y.o.   MRN: [id number]  Subjective: This patient presents for ongoing debridement of pre-ulcerative plantar callus on the left foot. 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  Objective: Well-organized large hemorrhagic hyperkeratoses second to fourth MPJ that remains closed after debridement  Assessment: Pre-ulcerative plantar keratoses left Type II diabetic with a history of peripheral arterial disease  Plan: Debridement of pre-ulcerative keratoses without a bleeding Patient will continue to wear soft insole inside a athletic style work shoe  Reappoint 4 weeks

## 2014-06-08 ENCOUNTER — Ambulatory Visit: Payer: BLUE CROSS/BLUE SHIELD | Admitting: Podiatry

## 2014-06-29 ENCOUNTER — Ambulatory Visit (INDEPENDENT_AMBULATORY_CARE_PROVIDER_SITE_OTHER): Payer: BLUE CROSS/BLUE SHIELD | Admitting: Podiatry

## 2014-06-29 ENCOUNTER — Encounter: Payer: Self-pay | Admitting: Podiatry

## 2014-06-29 VITALS — BP 156/72 | HR 67 | Resp 16

## 2014-06-29 DIAGNOSIS — I739 Peripheral vascular disease, unspecified: Secondary | ICD-10-CM | POA: Diagnosis not present

## 2014-06-29 DIAGNOSIS — L84 Corns and callosities: Secondary | ICD-10-CM | POA: Diagnosis not present

## 2014-06-29 NOTE — Progress Notes (Signed)
Patient ID: Travis StarchLarry D Greaser, male   DOB: 1955-04-06, 59 y.o.   MRN: [id number]  Subjective: This patient presents for ongoing debridement of pre-ulcerative plantar callus on the left foot. 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 There is no surrounding erythema, edema or active drainage  Assessment: Diabetic with a history of peripheral arterial disease Pre-ulcerative plantar callus left  Plan: Debridement of plantar callus left Continue to wear athletic style shoes with soft insoles  Reappoint 4 weeks

## 2014-06-29 NOTE — Patient Instructions (Signed)
Diabetes and Foot Care Diabetes may cause you to have problems because of poor blood supply (circulation) to your feet and legs. This may cause the skin on your feet to become thinner, break easier, and heal more slowly. Your skin may become dry, and the skin may peel and crack. You may also have nerve damage in your legs and feet causing decreased feeling in them. You may not notice minor injuries to your feet that could lead to infections or more serious problems. Taking care of your feet is one of the most important things you can do for yourself.  HOME CARE INSTRUCTIONS  Wear shoes at all times, even in the house. Do not go barefoot. Bare feet are easily injured.  Check your feet daily for blisters, cuts, and redness. If you cannot see the bottom of your feet, use a mirror or ask someone for help.  Wash your feet with warm water (do not use hot water) and mild soap. Then pat your feet and the areas between your toes until they are completely dry. Do not soak your feet as this can dry your skin.  Apply a moisturizing lotion or petroleum jelly (that does not contain alcohol and is unscented) to the skin on your feet and to dry, brittle toenails. Do not apply lotion between your toes.  Trim your toenails straight across. Do not dig under them or around the cuticle. File the edges of your nails with an emery board or nail file.  Do not cut corns or calluses or try to remove them with medicine.  Wear clean socks or stockings every day. Make sure they are not too tight. Do not wear knee-high stockings since they may decrease blood flow to your legs.  Wear shoes that fit properly and have enough cushioning. To break in new shoes, wear them for just a few hours a day. This prevents you from injuring your feet. Always look in your shoes before you put them on to be sure there are no objects inside.  Do not cross your legs. This may decrease the blood flow to your feet.  If you find a minor scrape,  cut, or break in the skin on your feet, keep it and the skin around it clean and dry. These areas may be cleansed with mild soap and water. Do not cleanse the area with peroxide, alcohol, or iodine.  When you remove an adhesive bandage, be sure not to damage the skin around it.  If you have a wound, look at it several times a day to make sure it is healing.  Do not use heating pads or hot water bottles. They may burn your skin. If you have lost feeling in your feet or legs, you may not know it is happening until it is too late.  Make sure your health care provider performs a complete foot exam at least annually or more often if you have foot problems. Report any cuts, sores, or bruises to your health care provider immediately. SEEK MEDICAL CARE IF:   You have an injury that is not healing.  You have cuts or breaks in the skin.  You have an ingrown nail.  You notice redness on your legs or feet.  You feel burning or tingling in your legs or feet.  You have pain or cramps in your legs and feet.  Your legs or feet are numb.  Your feet always feel cold. SEEK IMMEDIATE MEDICAL CARE IF:   There is increasing redness,   swelling, or pain in or around a wound.  There is a red line that goes up your leg.  Pus is coming from a wound.  You develop a fever or as directed by your health care provider.  You notice a bad smell coming from an ulcer or wound. Document Released: 03/01/2000 Document Revised: 11/04/2012 Document Reviewed: 08/11/2012 [employer] Patient Information 2015 [employer]. This information is not intended to replace advice given to you by your health care provider. Make sure you discuss any questions you have with your health care provider.  

## 2014-07-27 ENCOUNTER — Ambulatory Visit (INDEPENDENT_AMBULATORY_CARE_PROVIDER_SITE_OTHER): Payer: BLUE CROSS/BLUE SHIELD | Admitting: Podiatry

## 2014-07-27 ENCOUNTER — Encounter: Payer: Self-pay | Admitting: Podiatry

## 2014-07-27 DIAGNOSIS — L84 Corns and callosities: Secondary | ICD-10-CM

## 2014-07-27 DIAGNOSIS — I739 Peripheral vascular disease, unspecified: Secondary | ICD-10-CM

## 2014-07-27 DIAGNOSIS — B351 Tinea unguium: Secondary | ICD-10-CM | POA: Diagnosis not present

## 2014-07-27 NOTE — Patient Instructions (Signed)
Diabetes and Foot Care Diabetes may cause you to have problems because of poor blood supply (circulation) to your feet and legs. This may cause the skin on your feet to become thinner, break easier, and heal more slowly. Your skin may become dry, and the skin may peel and crack. You may also have nerve damage in your legs and feet causing decreased feeling in them. You may not notice minor injuries to your feet that could lead to infections or more serious problems. Taking care of your feet is one of the most important things you can do for yourself.  HOME CARE INSTRUCTIONS  Wear shoes at all times, even in the house. Do not go barefoot. Bare feet are easily injured.  Check your feet daily for blisters, cuts, and redness. If you cannot see the bottom of your feet, use a mirror or ask someone for help.  Wash your feet with warm water (do not use hot water) and mild soap. Then pat your feet and the areas between your toes until they are completely dry. Do not soak your feet as this can dry your skin.  Apply a moisturizing lotion or petroleum jelly (that does not contain alcohol and is unscented) to the skin on your feet and to dry, brittle toenails. Do not apply lotion between your toes.  Trim your toenails straight across. Do not dig under them or around the cuticle. File the edges of your nails with an emery board or nail file.  Do not cut corns or calluses or try to remove them with medicine.  Wear clean socks or stockings every day. Make sure they are not too tight. Do not wear knee-high stockings since they may decrease blood flow to your legs.  Wear shoes that fit properly and have enough cushioning. To break in new shoes, wear them for just a few hours a day. This prevents you from injuring your feet. Always look in your shoes before you put them on to be sure there are no objects inside.  Do not cross your legs. This may decrease the blood flow to your feet.  If you find a minor scrape,  cut, or break in the skin on your feet, keep it and the skin around it clean and dry. These areas may be cleansed with mild soap and water. Do not cleanse the area with peroxide, alcohol, or iodine.  When you remove an adhesive bandage, be sure not to damage the skin around it.  If you have a wound, look at it several times a day to make sure it is healing.  Do not use heating pads or hot water bottles. They may burn your skin. If you have lost feeling in your feet or legs, you may not know it is happening until it is too late.  Make sure your health care provider performs a complete foot exam at least annually or more often if you have foot problems. Report any cuts, sores, or bruises to your health care provider immediately. SEEK MEDICAL CARE IF:   You have an injury that is not healing.  You have cuts or breaks in the skin.  You have an ingrown nail.  You notice redness on your legs or feet.  You feel burning or tingling in your legs or feet.  You have pain or cramps in your legs and feet.  Your legs or feet are numb.  Your feet always feel cold. SEEK IMMEDIATE MEDICAL CARE IF:   There is increasing redness,   swelling, or pain in or around a wound.  There is a red line that goes up your leg.  Pus is coming from a wound.  You develop a fever or as directed by your health care provider.  You notice a bad smell coming from an ulcer or wound. Document Released: 03/01/2000 Document Revised: 11/04/2012 Document Reviewed: 08/11/2012 [employer] Patient Information 2015 [employer]. This information is not intended to replace advice given to you by your health care provider. Make sure you discuss any questions you have with your health care provider.  

## 2014-07-29 NOTE — Progress Notes (Signed)
Patient ID: Travis StarchLarry D Brinley, male   DOB: 11-25-1955, 59 y.o.   MRN: [id number]  Subjective: This patient presents for ongoing debridement of pre-ulcerative plantar callus in the left foot at approximately 4 week intervals. 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 In addition patient also is complaining of uncomfortable toenails  Objective: Large circular hemorrhagic plantar keratoses trailing second to fourth left MPJ without any surrounding erythema, edema, warmth or active drainage. There is no bleeding after debridement The toenails are elongated, incurvated, discolored 6-10  Assessment: Diabetic with a history of peripheral arterial disease Pre-ulcerative plantar callus left Mycotic toenails 6-10  Plan: Debride plantar keratoses 1 and  nails 10 without any bleeding  Patient will continue wearing athletic style shoes with soft insoles  Reappoint 4 weeks

## 2014-08-24 ENCOUNTER — Encounter: Payer: Self-pay | Admitting: Podiatry

## 2014-08-24 ENCOUNTER — Ambulatory Visit (INDEPENDENT_AMBULATORY_CARE_PROVIDER_SITE_OTHER): Payer: BLUE CROSS/BLUE SHIELD | Admitting: Podiatry

## 2014-08-24 VITALS — Temp 97.7°F | Resp 14

## 2014-08-24 DIAGNOSIS — L84 Corns and callosities: Secondary | ICD-10-CM

## 2014-08-24 DIAGNOSIS — E1151 Type 2 diabetes mellitus with diabetic peripheral angiopathy without gangrene: Secondary | ICD-10-CM

## 2014-08-24 NOTE — Patient Instructions (Signed)
Diabetes and Foot Care Diabetes may cause you to have problems because of poor blood supply (circulation) to your feet and legs. This may cause the skin on your feet to become thinner, break easier, and heal more slowly. Your skin may become dry, and the skin may peel and crack. You may also have nerve damage in your legs and feet causing decreased feeling in them. You may not notice minor injuries to your feet that could lead to infections or more serious problems. Taking care of your feet is one of the most important things you can do for yourself.  HOME CARE INSTRUCTIONS  Wear shoes at all times, even in the house. Do not go barefoot. Bare feet are easily injured.  Check your feet daily for blisters, cuts, and redness. If you cannot see the bottom of your feet, use a mirror or ask someone for help.  Wash your feet with warm water (do not use hot water) and mild soap. Then pat your feet and the areas between your toes until they are completely dry. Do not soak your feet as this can dry your skin.  Apply a moisturizing lotion or petroleum jelly (that does not contain alcohol and is unscented) to the skin on your feet and to dry, brittle toenails. Do not apply lotion between your toes.  Trim your toenails straight across. Do not dig under them or around the cuticle. File the edges of your nails with an emery board or nail file.  Do not cut corns or calluses or try to remove them with medicine.  Wear clean socks or stockings every day. Make sure they are not too tight. Do not wear knee-high stockings since they may decrease blood flow to your legs.  Wear shoes that fit properly and have enough cushioning. To break in new shoes, wear them for just a few hours a day. This prevents you from injuring your feet. Always look in your shoes before you put them on to be sure there are no objects inside.  Do not cross your legs. This may decrease the blood flow to your feet.  If you find a minor scrape,  cut, or break in the skin on your feet, keep it and the skin around it clean and dry. These areas may be cleansed with mild soap and water. Do not cleanse the area with peroxide, alcohol, or iodine.  When you remove an adhesive bandage, be sure not to damage the skin around it.  If you have a wound, look at it several times a day to make sure it is healing.  Do not use heating pads or hot water bottles. They may burn your skin. If you have lost feeling in your feet or legs, you may not know it is happening until it is too late.  Make sure your health care provider performs a complete foot exam at least annually or more often if you have foot problems. Report any cuts, sores, or bruises to your health care provider immediately. SEEK MEDICAL CARE IF:   You have an injury that is not healing.  You have cuts or breaks in the skin.  You have an ingrown nail.  You notice redness on your legs or feet.  You feel burning or tingling in your legs or feet.  You have pain or cramps in your legs and feet.  Your legs or feet are numb.  Your feet always feel cold. SEEK IMMEDIATE MEDICAL CARE IF:   There is increasing redness,   swelling, or pain in or around a wound.  There is a red line that goes up your leg.  Pus is coming from a wound.  You develop a fever or as directed by your health care provider.  You notice a bad smell coming from an ulcer or wound. Document Released: 03/01/2000 Document Revised: 11/04/2012 Document Reviewed: 08/11/2012 [employer] Patient Information 2015 [employer]. This information is not intended to replace advice given to you by your health care provider. Make sure you discuss any questions you have with your health care provider.  

## 2014-08-25 NOTE — Progress Notes (Signed)
Patient ID: Travis Jensen, male   DOB: 02-19-1956, 59 y.o.   MRN: [id number]  Subjective: This patient presents for ongoing debridement of pre-ulcerative bleeding callus on the plantar aspect left foot at approximately 4 week intervals. 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 After debridement the area remains closed  Assessment: Diabetic with a history of peripheral arterial disease Pre-ulcerative plantar callus left  Plan: Debride plantar keratoses left without any bleeding Patient will continue wearing athletic style shoes with soft insoles  Reappoint 4 weeks

## 2014-09-21 ENCOUNTER — Ambulatory Visit (INDEPENDENT_AMBULATORY_CARE_PROVIDER_SITE_OTHER): Payer: BLUE CROSS/BLUE SHIELD | Admitting: Podiatry

## 2014-09-21 ENCOUNTER — Encounter: Payer: Self-pay | Admitting: Podiatry

## 2014-09-21 VITALS — BP 186/99 | HR 68 | Temp 98.3°F | Resp 14

## 2014-09-21 DIAGNOSIS — B351 Tinea unguium: Secondary | ICD-10-CM | POA: Diagnosis not present

## 2014-09-21 DIAGNOSIS — E1151 Type 2 diabetes mellitus with diabetic peripheral angiopathy without gangrene: Secondary | ICD-10-CM | POA: Diagnosis not present

## 2014-09-21 DIAGNOSIS — L84 Corns and callosities: Secondary | ICD-10-CM | POA: Diagnosis not present

## 2014-09-21 NOTE — Patient Instructions (Signed)
Diabetes and Foot Care Diabetes may cause you to have problems because of poor blood supply (circulation) to your feet and legs. This may cause the skin on your feet to become thinner, break easier, and heal more slowly. Your skin may become dry, and the skin may peel and crack. You may also have nerve damage in your legs and feet causing decreased feeling in them. You may not notice minor injuries to your feet that could lead to infections or more serious problems. Taking care of your feet is one of the most important things you can do for yourself.  HOME CARE INSTRUCTIONS  Wear shoes at all times, even in the house. Do not go barefoot. Bare feet are easily injured.  Check your feet daily for blisters, cuts, and redness. If you cannot see the bottom of your feet, use a mirror or ask someone for help.  Wash your feet with warm water (do not use hot water) and mild soap. Then pat your feet and the areas between your toes until they are completely dry. Do not soak your feet as this can dry your skin.  Apply a moisturizing lotion or petroleum jelly (that does not contain alcohol and is unscented) to the skin on your feet and to dry, brittle toenails. Do not apply lotion between your toes.  Trim your toenails straight across. Do not dig under them or around the cuticle. File the edges of your nails with an emery board or nail file.  Do not cut corns or calluses or try to remove them with medicine.  Wear clean socks or stockings every day. Make sure they are not too tight. Do not wear knee-high stockings since they may decrease blood flow to your legs.  Wear shoes that fit properly and have enough cushioning. To break in new shoes, wear them for just a few hours a day. This prevents you from injuring your feet. Always look in your shoes before you put them on to be sure there are no objects inside.  Do not cross your legs. This may decrease the blood flow to your feet.  If you find a minor scrape,  cut, or break in the skin on your feet, keep it and the skin around it clean and dry. These areas may be cleansed with mild soap and water. Do not cleanse the area with peroxide, alcohol, or iodine.  When you remove an adhesive bandage, be sure not to damage the skin around it.  If you have a wound, look at it several times a day to make sure it is healing.  Do not use heating pads or hot water bottles. They may burn your skin. If you have lost feeling in your feet or legs, you may not know it is happening until it is too late.  Make sure your health care provider performs a complete foot exam at least annually or more often if you have foot problems. Report any cuts, sores, or bruises to your health care provider immediately. SEEK MEDICAL CARE IF:   You have an injury that is not healing.  You have cuts or breaks in the skin.  You have an ingrown nail.  You notice redness on your legs or feet.  You feel burning or tingling in your legs or feet.  You have pain or cramps in your legs and feet.  Your legs or feet are numb.  Your feet always feel cold. SEEK IMMEDIATE MEDICAL CARE IF:   There is increasing redness,   swelling, or pain in or around a wound.  There is a red line that goes up your leg.  Pus is coming from a wound.  You develop a fever or as directed by your health care provider.  You notice a bad smell coming from an ulcer or wound. Document Released: 03/01/2000 Document Revised: 11/04/2012 Document Reviewed: 08/11/2012 [employer] Patient Information 2015 [employer]. This information is not intended to replace advice given to you by your health care provider. Make sure you discuss any questions you have with your health care provider.  

## 2014-09-22 NOTE — Progress Notes (Signed)
Patient ID: Luvenia StarchLarry D Morson, male   DOB: 1955/08/09, 59 y.o.   MRN: [id number]  Subjective: This patient presents for ongoing debridement of pre-ulcerative plantar callus on the left foot as well as complaining of painful toenails right and left.  Objective: Bleeding callus plantar left second-third MPJ that remains closed after debridement The toenails are brittle, elongated, discolored and tender to direct palpation 6-10  Assessment: Diabetic with a history of peripheral arterial disease Mycotic toenails 6-10 Pre-ulcerative plantar callus left  Plan: Debridement of plantar callus left without a bleeding Debrided toenails 6-10 mechanical he an electrically without any bleeding  Reappoint 4 weeks

## 2014-10-19 ENCOUNTER — Ambulatory Visit (INDEPENDENT_AMBULATORY_CARE_PROVIDER_SITE_OTHER): Payer: BLUE CROSS/BLUE SHIELD | Admitting: Podiatry

## 2014-10-19 ENCOUNTER — Encounter: Payer: Self-pay | Admitting: Podiatry

## 2014-10-19 VITALS — BP 164/86 | HR 64 | Resp 12

## 2014-10-19 DIAGNOSIS — L84 Corns and callosities: Secondary | ICD-10-CM | POA: Diagnosis not present

## 2014-10-19 DIAGNOSIS — E1151 Type 2 diabetes mellitus with diabetic peripheral angiopathy without gangrene: Secondary | ICD-10-CM

## 2014-10-19 NOTE — Patient Instructions (Signed)
Diabetes and Foot Care Diabetes may cause you to have problems because of poor blood supply (circulation) to your feet and legs. This may cause the skin on your feet to become thinner, break easier, and heal more slowly. Your skin may become dry, and the skin may peel and crack. You may also have nerve damage in your legs and feet causing decreased feeling in them. You may not notice minor injuries to your feet that could lead to infections or more serious problems. Taking care of your feet is one of the most important things you can do for yourself.  HOME CARE INSTRUCTIONS  Wear shoes at all times, even in the house. Do not go barefoot. Bare feet are easily injured.  Check your feet daily for blisters, cuts, and redness. If you cannot see the bottom of your feet, use a mirror or ask someone for help.  Wash your feet with warm water (do not use hot water) and mild soap. Then pat your feet and the areas between your toes until they are completely dry. Do not soak your feet as this can dry your skin.  Apply a moisturizing lotion or petroleum jelly (that does not contain alcohol and is unscented) to the skin on your feet and to dry, brittle toenails. Do not apply lotion between your toes.  Trim your toenails straight across. Do not dig under them or around the cuticle. File the edges of your nails with an emery board or nail file.  Do not cut corns or calluses or try to remove them with medicine.  Wear clean socks or stockings every day. Make sure they are not too tight. Do not wear knee-high stockings since they may decrease blood flow to your legs.  Wear shoes that fit properly and have enough cushioning. To break in new shoes, wear them for just a few hours a day. This prevents you from injuring your feet. Always look in your shoes before you put them on to be sure there are no objects inside.  Do not cross your legs. This may decrease the blood flow to your feet.  If you find a minor scrape,  cut, or break in the skin on your feet, keep it and the skin around it clean and dry. These areas may be cleansed with mild soap and water. Do not cleanse the area with peroxide, alcohol, or iodine.  When you remove an adhesive bandage, be sure not to damage the skin around it.  If you have a wound, look at it several times a day to make sure it is healing.  Do not use heating pads or hot water bottles. They may burn your skin. If you have lost feeling in your feet or legs, you may not know it is happening until it is too late.  Make sure your health care provider performs a complete foot exam at least annually or more often if you have foot problems. Report any cuts, sores, or bruises to your health care provider immediately. SEEK MEDICAL CARE IF:   You have an injury that is not healing.  You have cuts or breaks in the skin.  You have an ingrown nail.  You notice redness on your legs or feet.  You feel burning or tingling in your legs or feet.  You have pain or cramps in your legs and feet.  Your legs or feet are numb.  Your feet always feel cold. SEEK IMMEDIATE MEDICAL CARE IF:   There is increasing redness,   swelling, or pain in or around a wound.  There is a red line that goes up your leg.  Pus is coming from a wound.  You develop a fever or as directed by your health care provider.  You notice a bad smell coming from an ulcer or wound. Document Released: 03/01/2000 Document Revised: 11/04/2012 Document Reviewed: 08/11/2012 [employer] Patient Information 2015 [employer]. This information is not intended to replace advice given to you by your health care provider. Make sure you discuss any questions you have with your health care provider.  

## 2014-10-20 NOTE — Progress Notes (Signed)
Patient ID: Travis Jensen, male   DOB: 1955/10/08, 59 y.o.   MRN: [id number]  Subjective: This patient presents for ongoing debridement of pre-ulcerative callus on the plantar aspect left feet at approximately 4 weeks intervals.  Objective: Bleeding callus plantar second-third MPJ left without any surrounding erythema, edema or drainage  Assessment: Pre-ulcerative plantar callus left Diabetic with peripheral arterial disease  Plan: Debridement of plantar callus left without any bleeding Patient will wear soft accommodative pads inside athletic or work style shoes  Reappoint 4 weeks

## 2014-11-16 ENCOUNTER — Encounter: Payer: Self-pay | Admitting: Podiatry

## 2014-11-16 ENCOUNTER — Ambulatory Visit (INDEPENDENT_AMBULATORY_CARE_PROVIDER_SITE_OTHER): Payer: BLUE CROSS/BLUE SHIELD | Admitting: Podiatry

## 2014-11-16 VITALS — BP 131/72 | HR 77 | Resp 14

## 2014-11-16 DIAGNOSIS — L84 Corns and callosities: Secondary | ICD-10-CM

## 2014-11-16 DIAGNOSIS — E1151 Type 2 diabetes mellitus with diabetic peripheral angiopathy without gangrene: Secondary | ICD-10-CM | POA: Diagnosis not present

## 2014-11-16 NOTE — Progress Notes (Signed)
Patient ID: Travis Jensen, male   DOB: [birth year], 59 y.o.   MRN: [id number]  Subjective: This patient presents for scheduled visit an approximate 4 week intervals for debridement of pre-ulcerative plantar callus on the left foot  Objective: Large well-organized plantar keratoses sub-second third MPJ left with punctate areas of bleeding within the callus. There is no surrounding erythema, edema, drainage or warmth  Assessment: Diabetic with peripheral arterial disease Pre-ulcerative plantar callus left  Plan: Debride plantar callus left without any bleeding Continue wearing soft accommodative pads inside athletic or work shoes  Reappoint 4 weeks

## 2014-11-16 NOTE — Patient Instructions (Signed)
Diabetes and Foot Care Diabetes may cause you to have problems because of poor blood supply (circulation) to your feet and legs. This may cause the skin on your feet to become thinner, break easier, and heal more slowly. Your skin may become dry, and the skin may peel and crack. You may also have nerve damage in your legs and feet causing decreased feeling in them. You may not notice minor injuries to your feet that could lead to infections or more serious problems. Taking care of your feet is one of the most important things you can do for yourself.  HOME CARE INSTRUCTIONS  Wear shoes at all times, even in the house. Do not go barefoot. Bare feet are easily injured.  Check your feet daily for blisters, cuts, and redness. If you cannot see the bottom of your feet, use a mirror or ask someone for help.  Wash your feet with warm water (do not use hot water) and mild soap. Then pat your feet and the areas between your toes until they are completely dry. Do not soak your feet as this can dry your skin.  Apply a moisturizing lotion or petroleum jelly (that does not contain alcohol and is unscented) to the skin on your feet and to dry, brittle toenails. Do not apply lotion between your toes.  Trim your toenails straight across. Do not dig under them or around the cuticle. File the edges of your nails with an emery board or nail file.  Do not cut corns or calluses or try to remove them with medicine.  Wear clean socks or stockings every day. Make sure they are not too tight. Do not wear knee-high stockings since they may decrease blood flow to your legs.  Wear shoes that fit properly and have enough cushioning. To break in new shoes, wear them for just a few hours a day. This prevents you from injuring your feet. Always look in your shoes before you put them on to be sure there are no objects inside.  Do not cross your legs. This may decrease the blood flow to your feet.  If you find a minor scrape,  cut, or break in the skin on your feet, keep it and the skin around it clean and dry. These areas may be cleansed with mild soap and water. Do not cleanse the area with peroxide, alcohol, or iodine.  When you remove an adhesive bandage, be sure not to damage the skin around it.  If you have a wound, look at it several times a day to make sure it is healing.  Do not use heating pads or hot water bottles. They may burn your skin. If you have lost feeling in your feet or legs, you may not know it is happening until it is too late.  Make sure your health care provider performs a complete foot exam at least annually or more often if you have foot problems. Report any cuts, sores, or bruises to your health care provider immediately. SEEK MEDICAL CARE IF:   You have an injury that is not healing.  You have cuts or breaks in the skin.  You have an ingrown nail.  You notice redness on your legs or feet.  You feel burning or tingling in your legs or feet.  You have pain or cramps in your legs and feet.  Your legs or feet are numb.  Your feet always feel cold. SEEK IMMEDIATE MEDICAL CARE IF:   There is increasing redness,   swelling, or pain in or around a wound.  There is a red line that goes up your leg.  Pus is coming from a wound.  You develop a fever or as directed by your health care provider.  You notice a bad smell coming from an ulcer or wound. Document Released: 03/01/2000 Document Revised: 11/04/2012 Document Reviewed: 08/11/2012 [employer] Patient Information 2015 [employer]. This information is not intended to replace advice given to you by your health care provider. Make sure you discuss any questions you have with your health care provider.  

## 2014-12-14 ENCOUNTER — Ambulatory Visit (INDEPENDENT_AMBULATORY_CARE_PROVIDER_SITE_OTHER): Payer: BLUE CROSS/BLUE SHIELD | Admitting: Podiatry

## 2014-12-14 ENCOUNTER — Encounter: Payer: Self-pay | Admitting: Podiatry

## 2014-12-14 DIAGNOSIS — E1151 Type 2 diabetes mellitus with diabetic peripheral angiopathy without gangrene: Secondary | ICD-10-CM

## 2014-12-14 DIAGNOSIS — L84 Corns and callosities: Secondary | ICD-10-CM

## 2014-12-14 NOTE — Patient Instructions (Signed)
Diabetes and Foot Care Diabetes may cause you to have problems because of poor blood supply (circulation) to your feet and legs. This may cause the skin on your feet to become thinner, break easier, and heal more slowly. Your skin may become dry, and the skin may peel and crack. You may also have nerve damage in your legs and feet causing decreased feeling in them. You may not notice minor injuries to your feet that could lead to infections or more serious problems. Taking care of your feet is one of the most important things you can do for yourself.  HOME CARE INSTRUCTIONS  Wear shoes at all times, even in the house. Do not go barefoot. Bare feet are easily injured.  Check your feet daily for blisters, cuts, and redness. If you cannot see the bottom of your feet, use a mirror or ask someone for help.  Wash your feet with warm water (do not use hot water) and mild soap. Then pat your feet and the areas between your toes until they are completely dry. Do not soak your feet as this can dry your skin.  Apply a moisturizing lotion or petroleum jelly (that does not contain alcohol and is unscented) to the skin on your feet and to dry, brittle toenails. Do not apply lotion between your toes.  Trim your toenails straight across. Do not dig under them or around the cuticle. File the edges of your nails with an emery board or nail file.  Do not cut corns or calluses or try to remove them with medicine.  Wear clean socks or stockings every day. Make sure they are not too tight. Do not wear knee-high stockings since they may decrease blood flow to your legs.  Wear shoes that fit properly and have enough cushioning. To break in new shoes, wear them for just a few hours a day. This prevents you from injuring your feet. Always look in your shoes before you put them on to be sure there are no objects inside.  Do not cross your legs. This may decrease the blood flow to your feet.  If you find a minor scrape,  cut, or break in the skin on your feet, keep it and the skin around it clean and dry. These areas may be cleansed with mild soap and water. Do not cleanse the area with peroxide, alcohol, or iodine.  When you remove an adhesive bandage, be sure not to damage the skin around it.  If you have a wound, look at it several times a day to make sure it is healing.  Do not use heating pads or hot water bottles. They may burn your skin. If you have lost feeling in your feet or legs, you may not know it is happening until it is too late.  Make sure your health care provider performs a complete foot exam at least annually or more often if you have foot problems. Report any cuts, sores, or bruises to your health care provider immediately. SEEK MEDICAL CARE IF:   You have an injury that is not healing.  You have cuts or breaks in the skin.  You have an ingrown nail.  You notice redness on your legs or feet.  You feel burning or tingling in your legs or feet.  You have pain or cramps in your legs and feet.  Your legs or feet are numb.  Your feet always feel cold. SEEK IMMEDIATE MEDICAL CARE IF:   There is increasing redness,   swelling, or pain in or around a wound.  There is a red line that goes up your leg.  Pus is coming from a wound.  You develop a fever or as directed by your health care provider.  You notice a bad smell coming from an ulcer or wound. Document Released: 03/01/2000 Document Revised: 11/04/2012 Document Reviewed: 08/11/2012 [employer] Patient Information 2015 [employer]. This information is not intended to replace advice given to you by your health care provider. Make sure you discuss any questions you have with your health care provider.  

## 2014-12-14 NOTE — Progress Notes (Signed)
Patient ID: Travis Jensen, male   DOB: [birth year], 59 y.o.   MRN: [id number]  Subjective: This patient presents at 4 week intervals for debridement of pre-ulcerative plantar callus on the left foot  Objective: Orientated 3 No open skin lesions noted bilaterally Well-organized hyperkeratotic lesion sub-second third MPJ left with occasional punctate areas of dried blood within the callus. There is no surrounding erythema, edema, drainage or warmth  Assessment: Diabetic with peripheral arterial disease Pre-ulcerative plantar callus left  Plan: Debridement plantar callus left without any bleeding Continue to wear soft accommodative pads inside athletic style or work shoes  Reappoint 4 with

## 2015-01-11 ENCOUNTER — Encounter: Payer: Self-pay | Admitting: Podiatry

## 2015-01-11 ENCOUNTER — Ambulatory Visit (INDEPENDENT_AMBULATORY_CARE_PROVIDER_SITE_OTHER): Payer: BLUE CROSS/BLUE SHIELD | Admitting: Podiatry

## 2015-01-11 VITALS — BP 193/108 | HR 69 | Temp 98.1°F | Resp 18

## 2015-01-11 DIAGNOSIS — L84 Corns and callosities: Secondary | ICD-10-CM

## 2015-01-11 DIAGNOSIS — E1151 Type 2 diabetes mellitus with diabetic peripheral angiopathy without gangrene: Secondary | ICD-10-CM

## 2015-01-11 NOTE — Patient Instructions (Signed)
Diabetes and Foot Care Diabetes may cause you to have problems because of poor blood supply (circulation) to your feet and legs. This may cause the skin on your feet to become thinner, break easier, and heal more slowly. Your skin may become dry, and the skin may peel and crack. You may also have nerve damage in your legs and feet causing decreased feeling in them. You may not notice minor injuries to your feet that could lead to infections or more serious problems. Taking care of your feet is one of the most important things you can do for yourself.  HOME CARE INSTRUCTIONS  Wear shoes at all times, even in the house. Do not go barefoot. Bare feet are easily injured.  Check your feet daily for blisters, cuts, and redness. If you cannot see the bottom of your feet, use a mirror or ask someone for help.  Wash your feet with warm water (do not use hot water) and mild soap. Then pat your feet and the areas between your toes until they are completely dry. Do not soak your feet as this can dry your skin.  Apply a moisturizing lotion or petroleum jelly (that does not contain alcohol and is unscented) to the skin on your feet and to dry, brittle toenails. Do not apply lotion between your toes.  Trim your toenails straight across. Do not dig under them or around the cuticle. File the edges of your nails with an emery board or nail file.  Do not cut corns or calluses or try to remove them with medicine.  Wear clean socks or stockings every day. Make sure they are not too tight. Do not wear knee-high stockings since they may decrease blood flow to your legs.  Wear shoes that fit properly and have enough cushioning. To break in new shoes, wear them for just a few hours a day. This prevents you from injuring your feet. Always look in your shoes before you put them on to be sure there are no objects inside.  Do not cross your legs. This may decrease the blood flow to your feet.  If you find a minor scrape,  cut, or break in the skin on your feet, keep it and the skin around it clean and dry. These areas may be cleansed with mild soap and water. Do not cleanse the area with peroxide, alcohol, or iodine.  When you remove an adhesive bandage, be sure not to damage the skin around it.  If you have a wound, look at it several times a day to make sure it is healing.  Do not use heating pads or hot water bottles. They may burn your skin. If you have lost feeling in your feet or legs, you may not know it is happening until it is too late.  Make sure your health care provider performs a complete foot exam at least annually or more often if you have foot problems. Report any cuts, sores, or bruises to your health care provider immediately. SEEK MEDICAL CARE IF:   You have an injury that is not healing.  You have cuts or breaks in the skin.  You have an ingrown nail.  You notice redness on your legs or feet.  You feel burning or tingling in your legs or feet.  You have pain or cramps in your legs and feet.  Your legs or feet are numb.  Your feet always feel cold. SEEK IMMEDIATE MEDICAL CARE IF:   There is increasing redness,   swelling, or pain in or around a wound.  There is a red line that goes up your leg.  Pus is coming from a wound.  You develop a fever or as directed by your health care provider.  You notice a bad smell coming from an ulcer or wound.   This information is not intended to replace advice given to you by your health care provider. Make sure you discuss any questions you have with your health care provider.   Document Released: 03/01/2000 Document Revised: 11/04/2012 Document Reviewed: 08/11/2012 Elsevier Interactive Patient Education 2016 [employer]  

## 2015-01-11 NOTE — Progress Notes (Signed)
Patient ID: Travis StarchLarry D Hafer, male   DOB: [birth year], 59 y.o.   MRN: [id number]  Subjective: This patient presents for ongoing care for pre-ulcerative plantar callus on the plantar aspect left foot in approximately 4 week intervals.  Objective: Orientated 3 No open skin lesions bilaterally Plantar callus sub 2-3 MPJ left has slight bleeding within the callused area. The callus remains closed after debridement. There is no surrounding erythema, edema or drainage  Assessment: Diabetic with peripheral arterial disease Pre-ulcerative plantar callus left  Plan: Debrided pre-ulcerative plantar callus left without a bleeding Continue to wear soft accommodative pads in athletic style shoes  Reappoint 4 weeks

## 2015-02-08 ENCOUNTER — Ambulatory Visit (INDEPENDENT_AMBULATORY_CARE_PROVIDER_SITE_OTHER): Payer: BLUE CROSS/BLUE SHIELD | Admitting: Podiatry

## 2015-02-08 ENCOUNTER — Encounter: Payer: Self-pay | Admitting: Podiatry

## 2015-02-08 VITALS — BP 184/98 | HR 84 | Temp 98.7°F | Resp 18

## 2015-02-08 DIAGNOSIS — L84 Corns and callosities: Secondary | ICD-10-CM | POA: Diagnosis not present

## 2015-02-08 DIAGNOSIS — E1151 Type 2 diabetes mellitus with diabetic peripheral angiopathy without gangrene: Secondary | ICD-10-CM

## 2015-02-08 NOTE — Patient Instructions (Signed)
Diabetes and Foot Care Diabetes may cause you to have problems because of poor blood supply (circulation) to your feet and legs. This may cause the skin on your feet to become thinner, break easier, and heal more slowly. Your skin may become dry, and the skin may peel and crack. You may also have nerve damage in your legs and feet causing decreased feeling in them. You may not notice minor injuries to your feet that could lead to infections or more serious problems. Taking care of your feet is one of the most important things you can do for yourself.  HOME CARE INSTRUCTIONS  Wear shoes at all times, even in the house. Do not go barefoot. Bare feet are easily injured.  Check your feet daily for blisters, cuts, and redness. If you cannot see the bottom of your feet, use a mirror or ask someone for help.  Wash your feet with warm water (do not use hot water) and mild soap. Then pat your feet and the areas between your toes until they are completely dry. Do not soak your feet as this can dry your skin.  Apply a moisturizing lotion or petroleum jelly (that does not contain alcohol and is unscented) to the skin on your feet and to dry, brittle toenails. Do not apply lotion between your toes.  Trim your toenails straight across. Do not dig under them or around the cuticle. File the edges of your nails with an emery board or nail file.  Do not cut corns or calluses or try to remove them with medicine.  Wear clean socks or stockings every day. Make sure they are not too tight. Do not wear knee-high stockings since they may decrease blood flow to your legs.  Wear shoes that fit properly and have enough cushioning. To break in new shoes, wear them for just a few hours a day. This prevents you from injuring your feet. Always look in your shoes before you put them on to be sure there are no objects inside.  Do not cross your legs. This may decrease the blood flow to your feet.  If you find a minor scrape,  cut, or break in the skin on your feet, keep it and the skin around it clean and dry. These areas may be cleansed with mild soap and water. Do not cleanse the area with peroxide, alcohol, or iodine.  When you remove an adhesive bandage, be sure not to damage the skin around it.  If you have a wound, look at it several times a day to make sure it is healing.  Do not use heating pads or hot water bottles. They may burn your skin. If you have lost feeling in your feet or legs, you may not know it is happening until it is too late.  Make sure your health care provider performs a complete foot exam at least annually or more often if you have foot problems. Report any cuts, sores, or bruises to your health care provider immediately. SEEK MEDICAL CARE IF:   You have an injury that is not healing.  You have cuts or breaks in the skin.  You have an ingrown nail.  You notice redness on your legs or feet.  You feel burning or tingling in your legs or feet.  You have pain or cramps in your legs and feet.  Your legs or feet are numb.  Your feet always feel cold. SEEK IMMEDIATE MEDICAL CARE IF:   There is increasing redness,   swelling, or pain in or around a wound.  There is a red line that goes up your leg.  Pus is coming from a wound.  You develop a fever or as directed by your health care provider.  You notice a bad smell coming from an ulcer or wound.   This information is not intended to replace advice given to you by your health care provider. Make sure you discuss any questions you have with your health care provider.   Document Released: 03/01/2000 Document Revised: 11/04/2012 Document Reviewed: 08/11/2012 Elsevier Interactive Patient Education 2016 [employer]  

## 2015-02-09 NOTE — Progress Notes (Signed)
Patient ID: Travis StarchLarry D Mahaney, male   DOB: Sep 23, 1955, 59 y.o.   MRN: [id number]  Subjective: This patient presents for scheduled visit in approximately 4 weeks for repetitive debridement of pre-ulcerative plantar callus on the left foot  Objective: Orientated 3 Patient is wife present treatment room Large well-organized plantar callus sub-second third MPJ left with occasional area bleeding within callus. The callus remains closed after debridement  Assessment: Diabetic with peripheral arterial disease Pre-ulcerative plantar callus left  Plan: Debrided pre-ulcerative plantar callus left without any bleeding Patient will continue wearing soft accommodative pads in athletic style shoes  Reappoint 4 weeks

## 2015-03-08 ENCOUNTER — Encounter: Payer: Self-pay | Admitting: Podiatry

## 2015-03-08 ENCOUNTER — Ambulatory Visit (INDEPENDENT_AMBULATORY_CARE_PROVIDER_SITE_OTHER): Payer: BLUE CROSS/BLUE SHIELD | Admitting: Podiatry

## 2015-03-08 VITALS — BP 162/96 | HR 94 | Resp 12

## 2015-03-08 DIAGNOSIS — L84 Corns and callosities: Secondary | ICD-10-CM | POA: Diagnosis not present

## 2015-03-08 DIAGNOSIS — E1151 Type 2 diabetes mellitus with diabetic peripheral angiopathy without gangrene: Secondary | ICD-10-CM

## 2015-03-08 NOTE — Patient Instructions (Signed)
Diabetes and Foot Care Diabetes may cause you to have problems because of poor blood supply (circulation) to your feet and legs. This may cause the skin on your feet to become thinner, break easier, and heal more slowly. Your skin may become dry, and the skin may peel and crack. You may also have nerve damage in your legs and feet causing decreased feeling in them. You may not notice minor injuries to your feet that could lead to infections or more serious problems. Taking care of your feet is one of the most important things you can do for yourself.  HOME CARE INSTRUCTIONS  Wear shoes at all times, even in the house. Do not go barefoot. Bare feet are easily injured.  Check your feet daily for blisters, cuts, and redness. If you cannot see the bottom of your feet, use a mirror or ask someone for help.  Wash your feet with warm water (do not use hot water) and mild soap. Then pat your feet and the areas between your toes until they are completely dry. Do not soak your feet as this can dry your skin.  Apply a moisturizing lotion or petroleum jelly (that does not contain alcohol and is unscented) to the skin on your feet and to dry, brittle toenails. Do not apply lotion between your toes.  Trim your toenails straight across. Do not dig under them or around the cuticle. File the edges of your nails with an emery board or nail file.  Do not cut corns or calluses or try to remove them with medicine.  Wear clean socks or stockings every day. Make sure they are not too tight. Do not wear knee-high stockings since they may decrease blood flow to your legs.  Wear shoes that fit properly and have enough cushioning. To break in new shoes, wear them for just a few hours a day. This prevents you from injuring your feet. Always look in your shoes before you put them on to be sure there are no objects inside.  Do not cross your legs. This may decrease the blood flow to your feet.  If you find a minor scrape,  cut, or break in the skin on your feet, keep it and the skin around it clean and dry. These areas may be cleansed with mild soap and water. Do not cleanse the area with peroxide, alcohol, or iodine.  When you remove an adhesive bandage, be sure not to damage the skin around it.  If you have a wound, look at it several times a day to make sure it is healing.  Do not use heating pads or hot water bottles. They may burn your skin. If you have lost feeling in your feet or legs, you may not know it is happening until it is too late.  Make sure your health care provider performs a complete foot exam at least annually or more often if you have foot problems. Report any cuts, sores, or bruises to your health care provider immediately. SEEK MEDICAL CARE IF:   You have an injury that is not healing.  You have cuts or breaks in the skin.  You have an ingrown nail.  You notice redness on your legs or feet.  You feel burning or tingling in your legs or feet.  You have pain or cramps in your legs and feet.  Your legs or feet are numb.  Your feet always feel cold. SEEK IMMEDIATE MEDICAL CARE IF:   There is increasing redness,   swelling, or pain in or around a wound.  There is a red line that goes up your leg.  Pus is coming from a wound.  You develop a fever or as directed by your health care provider.  You notice a bad smell coming from an ulcer or wound.   This information is not intended to replace advice given to you by your health care provider. Make sure you discuss any questions you have with your health care provider.   Document Released: 03/01/2000 Document Revised: 11/04/2012 Document Reviewed: 08/11/2012 Elsevier Interactive Patient Education 2016 [employer]  

## 2015-03-09 NOTE — Progress Notes (Signed)
Patient ID: Travis Jensen, male   DOB: [birth year], 59 y.o.   MRN: [id number]  Subjective: This patient presents for ongoing schedule visits for debridement of pre-ulcerative plantar callus on the right foot.  Objective: Orientated 3 Large well-organized plantar callus left foot with occasional punctate areas of bleeding within the callus. The callus remains closed after debridement  Assessment: Diabetic with peripheral arterial disease and has been evaluated by vascular Dr. Lenn SinkPre-ulcerative plantar callus left  Plan: Debrided pre-ulcerative plantar callus left without any bleeding Patient will continue wearing soft accommodative pads in athletic style shoes  Reappoint 4 weeks

## 2015-04-05 ENCOUNTER — Ambulatory Visit (INDEPENDENT_AMBULATORY_CARE_PROVIDER_SITE_OTHER): Payer: BLUE CROSS/BLUE SHIELD | Admitting: Podiatry

## 2015-04-05 ENCOUNTER — Encounter: Payer: Self-pay | Admitting: Podiatry

## 2015-04-05 DIAGNOSIS — L84 Corns and callosities: Secondary | ICD-10-CM | POA: Diagnosis not present

## 2015-04-05 DIAGNOSIS — E1151 Type 2 diabetes mellitus with diabetic peripheral angiopathy without gangrene: Secondary | ICD-10-CM | POA: Diagnosis not present

## 2015-04-05 NOTE — Progress Notes (Signed)
Patient ID: Travis Jensen, male   DOB: [birth year], 59 y.o.   MRN: [id number]  Subjective: This patient presents for scheduled visit for ongoing debridement of for pre-ulcerative plantar callus on the plantar aspect of left foot  Objective: Well-organized hyperkeratotic lesion with occasional punctate bleeding within the lesion plantar second third MPJ, left foot No open lesions bilaterally  Assessment: Pre-ulcerative plantar callus, left foot Diabetic with peripheral arterial disease that has been evaluated by vascular Dr.  Roosvelt Harps: Debridement of plantar callus left without any bleeding  Reappoint 4 weeks

## 2015-04-05 NOTE — Patient Instructions (Signed)
Diabetes and Foot Care Diabetes may cause you to have problems because of poor blood supply (circulation) to your feet and legs. This may cause the skin on your feet to become thinner, break easier, and heal more slowly. Your skin may become dry, and the skin may peel and crack. You may also have nerve damage in your legs and feet causing decreased feeling in them. You may not notice minor injuries to your feet that could lead to infections or more serious problems. Taking care of your feet is one of the most important things you can do for yourself.  HOME CARE INSTRUCTIONS  Wear shoes at all times, even in the house. Do not go barefoot. Bare feet are easily injured.  Check your feet daily for blisters, cuts, and redness. If you cannot see the bottom of your feet, use a mirror or ask someone for help.  Wash your feet with warm water (do not use hot water) and mild soap. Then pat your feet and the areas between your toes until they are completely dry. Do not soak your feet as this can dry your skin.  Apply a moisturizing lotion or petroleum jelly (that does not contain alcohol and is unscented) to the skin on your feet and to dry, brittle toenails. Do not apply lotion between your toes.  Trim your toenails straight across. Do not dig under them or around the cuticle. File the edges of your nails with an emery board or nail file.  Do not cut corns or calluses or try to remove them with medicine.  Wear clean socks or stockings every day. Make sure they are not too tight. Do not wear knee-high stockings since they may decrease blood flow to your legs.  Wear shoes that fit properly and have enough cushioning. To break in new shoes, wear them for just a few hours a day. This prevents you from injuring your feet. Always look in your shoes before you put them on to be sure there are no objects inside.  Do not cross your legs. This may decrease the blood flow to your feet.  If you find a minor scrape,  cut, or break in the skin on your feet, keep it and the skin around it clean and dry. These areas may be cleansed with mild soap and water. Do not cleanse the area with peroxide, alcohol, or iodine.  When you remove an adhesive bandage, be sure not to damage the skin around it.  If you have a wound, look at it several times a day to make sure it is healing.  Do not use heating pads or hot water bottles. They may burn your skin. If you have lost feeling in your feet or legs, you may not know it is happening until it is too late.  Make sure your health care provider performs a complete foot exam at least annually or more often if you have foot problems. Report any cuts, sores, or bruises to your health care provider immediately. SEEK MEDICAL CARE IF:   You have an injury that is not healing.  You have cuts or breaks in the skin.  You have an ingrown nail.  You notice redness on your legs or feet.  You feel burning or tingling in your legs or feet.  You have pain or cramps in your legs and feet.  Your legs or feet are numb.  Your feet always feel cold. SEEK IMMEDIATE MEDICAL CARE IF:   There is increasing redness,   swelling, or pain in or around a wound.  There is a red line that goes up your leg.  Pus is coming from a wound.  You develop a fever or as directed by your health care provider.  You notice a bad smell coming from an ulcer or wound.   This information is not intended to replace advice given to you by your health care provider. Make sure you discuss any questions you have with your health care provider.   Document Released: 03/01/2000 Document Revised: 11/04/2012 Document Reviewed: 08/11/2012 Elsevier Interactive Patient Education 2016 [employer]  

## 2015-05-03 ENCOUNTER — Encounter: Payer: Self-pay | Admitting: Podiatry

## 2015-05-03 ENCOUNTER — Ambulatory Visit (INDEPENDENT_AMBULATORY_CARE_PROVIDER_SITE_OTHER): Payer: BLUE CROSS/BLUE SHIELD | Admitting: Podiatry

## 2015-05-03 DIAGNOSIS — L84 Corns and callosities: Secondary | ICD-10-CM

## 2015-05-03 DIAGNOSIS — E1151 Type 2 diabetes mellitus with diabetic peripheral angiopathy without gangrene: Secondary | ICD-10-CM

## 2015-05-03 NOTE — Patient Instructions (Signed)
Diabetes and Foot Care Diabetes may cause you to have problems because of poor blood supply (circulation) to your feet and legs. This may cause the skin on your feet to become thinner, break easier, and heal more slowly. Your skin may become dry, and the skin may peel and crack. You may also have nerve damage in your legs and feet causing decreased feeling in them. You may not notice minor injuries to your feet that could lead to infections or more serious problems. Taking care of your feet is one of the most important things you can do for yourself.  HOME CARE INSTRUCTIONS  Wear shoes at all times, even in the house. Do not go barefoot. Bare feet are easily injured.  Check your feet daily for blisters, cuts, and redness. If you cannot see the bottom of your feet, use a mirror or ask someone for help.  Wash your feet with warm water (do not use hot water) and mild soap. Then pat your feet and the areas between your toes until they are completely dry. Do not soak your feet as this can dry your skin.  Apply a moisturizing lotion or petroleum jelly (that does not contain alcohol and is unscented) to the skin on your feet and to dry, brittle toenails. Do not apply lotion between your toes.  Trim your toenails straight across. Do not dig under them or around the cuticle. File the edges of your nails with an emery board or nail file.  Do not cut corns or calluses or try to remove them with medicine.  Wear clean socks or stockings every day. Make sure they are not too tight. Do not wear knee-high stockings since they may decrease blood flow to your legs.  Wear shoes that fit properly and have enough cushioning. To break in new shoes, wear them for just a few hours a day. This prevents you from injuring your feet. Always look in your shoes before you put them on to be sure there are no objects inside.  Do not cross your legs. This may decrease the blood flow to your feet.  If you find a minor scrape,  cut, or break in the skin on your feet, keep it and the skin around it clean and dry. These areas may be cleansed with mild soap and water. Do not cleanse the area with peroxide, alcohol, or iodine.  When you remove an adhesive bandage, be sure not to damage the skin around it.  If you have a wound, look at it several times a day to make sure it is healing.  Do not use heating pads or hot water bottles. They may burn your skin. If you have lost feeling in your feet or legs, you may not know it is happening until it is too late.  Make sure your health care provider performs a complete foot exam at least annually or more often if you have foot problems. Report any cuts, sores, or bruises to your health care provider immediately. SEEK MEDICAL CARE IF:   You have an injury that is not healing.  You have cuts or breaks in the skin.  You have an ingrown nail.  You notice redness on your legs or feet.  You feel burning or tingling in your legs or feet.  You have pain or cramps in your legs and feet.  Your legs or feet are numb.  Your feet always feel cold. SEEK IMMEDIATE MEDICAL CARE IF:   There is increasing redness,   swelling, or pain in or around a wound.  There is a red line that goes up your leg.  Pus is coming from a wound.  You develop a fever or as directed by your health care provider.  You notice a bad smell coming from an ulcer or wound.   This information is not intended to replace advice given to you by your health care provider. Make sure you discuss any questions you have with your health care provider.   Document Released: 03/01/2000 Document Revised: 11/04/2012 Document Reviewed: 08/11/2012 Elsevier Interactive Patient Education 2016 [employer]  

## 2015-05-03 NOTE — Progress Notes (Signed)
Patient ID: Travis Jensen, male   DOB: 1956/02/28, 60 y.o.   MRN: [id number]   Subjective: This patient presents for ongoing schedule visits for debridement of pre-ulcerative plantar callus on the right foot. Patient's wife present to treatment room today  Objective: Orientated 3 Large well-organized plantar callus left foot with occasional punctate areas of bleeding within the callus. The callus remains closed after debridement  Assessment: Diabetic with peripheral arterial disease and has been evaluated by vascular Dr. Lenn Sink plantar callus left  Plan: Debrided pre-ulcerative plantar callus left without any bleeding Patient will continue wearing soft accommodative pads in athletic style shoes  Reappoint 4 weeks

## 2015-05-31 ENCOUNTER — Ambulatory Visit (INDEPENDENT_AMBULATORY_CARE_PROVIDER_SITE_OTHER): Payer: BLUE CROSS/BLUE SHIELD | Admitting: Podiatry

## 2015-05-31 ENCOUNTER — Encounter: Payer: Self-pay | Admitting: Podiatry

## 2015-05-31 VITALS — BP 178/101 | HR 69 | Resp 12

## 2015-05-31 DIAGNOSIS — E1151 Type 2 diabetes mellitus with diabetic peripheral angiopathy without gangrene: Secondary | ICD-10-CM

## 2015-05-31 DIAGNOSIS — M79676 Pain in unspecified toe(s): Secondary | ICD-10-CM | POA: Diagnosis not present

## 2015-05-31 DIAGNOSIS — B351 Tinea unguium: Secondary | ICD-10-CM

## 2015-05-31 DIAGNOSIS — L84 Corns and callosities: Secondary | ICD-10-CM

## 2015-05-31 NOTE — Patient Instructions (Signed)
Diabetes and Foot Care Diabetes may cause you to have problems because of poor blood supply (circulation) to your feet and legs. This may cause the skin on your feet to become thinner, break easier, and heal more slowly. Your skin may become dry, and the skin may peel and crack. You may also have nerve damage in your legs and feet causing decreased feeling in them. You may not notice minor injuries to your feet that could lead to infections or more serious problems. Taking care of your feet is one of the most important things you can do for yourself.  HOME CARE INSTRUCTIONS  Wear shoes at all times, even in the house. Do not go barefoot. Bare feet are easily injured.  Check your feet daily for blisters, cuts, and redness. If you cannot see the bottom of your feet, use a mirror or ask someone for help.  Wash your feet with warm water (do not use hot water) and mild soap. Then pat your feet and the areas between your toes until they are completely dry. Do not soak your feet as this can dry your skin.  Apply a moisturizing lotion or petroleum jelly (that does not contain alcohol and is unscented) to the skin on your feet and to dry, brittle toenails. Do not apply lotion between your toes.  Trim your toenails straight across. Do not dig under them or around the cuticle. File the edges of your nails with an emery board or nail file.  Do not cut corns or calluses or try to remove them with medicine.  Wear clean socks or stockings every day. Make sure they are not too tight. Do not wear knee-high stockings since they may decrease blood flow to your legs.  Wear shoes that fit properly and have enough cushioning. To break in new shoes, wear them for just a few hours a day. This prevents you from injuring your feet. Always look in your shoes before you put them on to be sure there are no objects inside.  Do not cross your legs. This may decrease the blood flow to your feet.  If you find a minor scrape,  cut, or break in the skin on your feet, keep it and the skin around it clean and dry. These areas may be cleansed with mild soap and water. Do not cleanse the area with peroxide, alcohol, or iodine.  When you remove an adhesive bandage, be sure not to damage the skin around it.  If you have a wound, look at it several times a day to make sure it is healing.  Do not use heating pads or hot water bottles. They may burn your skin. If you have lost feeling in your feet or legs, you may not know it is happening until it is too late.  Make sure your health care provider performs a complete foot exam at least annually or more often if you have foot problems. Report any cuts, sores, or bruises to your health care provider immediately. SEEK MEDICAL CARE IF:   You have an injury that is not healing.  You have cuts or breaks in the skin.  You have an ingrown nail.  You notice redness on your legs or feet.  You feel burning or tingling in your legs or feet.  You have pain or cramps in your legs and feet.  Your legs or feet are numb.  Your feet always feel cold. SEEK IMMEDIATE MEDICAL CARE IF:   There is increasing redness,   swelling, or pain in or around a wound.  There is a red line that goes up your leg.  Pus is coming from a wound.  You develop a fever or as directed by your health care provider.  You notice a bad smell coming from an ulcer or wound.   This information is not intended to replace advice given to you by your health care provider. Make sure you discuss any questions you have with your health care provider.   Document Released: 03/01/2000 Document Revised: 11/04/2012 Document Reviewed: 08/11/2012 Elsevier Interactive Patient Education 2016 [employer]  

## 2015-06-01 NOTE — Progress Notes (Signed)
Patient ID: Travis StarchLarry D Jensen, male   DOB: 05/01/1955, 60 y.o.   MRN: [id number]  Subjective: This patient presents again for scheduled visits for ongoing debridement of pre-ulcerative plantar callus on the right foot at approximately 4 month intervals. Patient also is complaining of thickened elongated toenails and requests toenail debridement  Objective: Orientated 3 patient presents with wife present in the treatment room Large well-organized plantar callus plantar left MPJ with occasional areas of punctate bleeding The toenails elongated, brittle, deformed and tender to direct palpation 6-10  Assessment: Diabetic with confirm peripheral arterial disease with evaluation by vascular Dr. Lenn SinkPre-ulcerative plantar callus right Symptomatic onychomycoses 6-10  Plan: Debridement toenails 6-10 mechanically an electrical without a bleeding Debrided pre-ulcerative plantar callus right without a bleeding  Reappoint 5 weeks

## 2015-07-05 ENCOUNTER — Ambulatory Visit (INDEPENDENT_AMBULATORY_CARE_PROVIDER_SITE_OTHER): Payer: BLUE CROSS/BLUE SHIELD | Admitting: Podiatry

## 2015-07-05 ENCOUNTER — Encounter: Payer: Self-pay | Admitting: Podiatry

## 2015-07-05 DIAGNOSIS — E1151 Type 2 diabetes mellitus with diabetic peripheral angiopathy without gangrene: Secondary | ICD-10-CM

## 2015-07-05 DIAGNOSIS — M79676 Pain in unspecified toe(s): Secondary | ICD-10-CM

## 2015-07-05 DIAGNOSIS — L84 Corns and callosities: Secondary | ICD-10-CM

## 2015-07-05 DIAGNOSIS — B351 Tinea unguium: Secondary | ICD-10-CM

## 2015-07-05 NOTE — Progress Notes (Signed)
Patient ID: Travis Jensen, male   DOB: 1955-08-28, 60 y.o.   MRN: [id number]  Subjective: This patient presents again for scheduled visits for ongoing debridement of pre-ulcerative plantar callus on the right foot at approximately 4 month intervals. Patient also is complaining of thickened elongated toenails and requests toenail debridement  Objective: Orientated 3 patient presents with wife present in the treatment room Large well-organized plantar callus plantar left MPJ with occasional areas of punctate bleeding The toenails elongated, brittle, deformed and tender to direct palpation 6-10  Assessment: Diabetic with confirm peripheral arterial disease with evaluation by vascular Dr. Lenn SinkPre-ulcerative plantar callus right Symptomatic onychomycoses 6-10  Plan: Debridement toenails 6-10 mechanically an electrical without a bleeding Debrided pre-ulcerative plantar callus right without a bleeding  Reappoint 4 weeks

## 2015-07-05 NOTE — Patient Instructions (Signed)
Diabetes and Foot Care Diabetes may cause you to have problems because of poor blood supply (circulation) to your feet and legs. This may cause the skin on your feet to become thinner, break easier, and heal more slowly. Your skin may become dry, and the skin may peel and crack. You may also have nerve damage in your legs and feet causing decreased feeling in them. You may not notice minor injuries to your feet that could lead to infections or more serious problems. Taking care of your feet is one of the most important things you can do for yourself.  HOME CARE INSTRUCTIONS  Wear shoes at all times, even in the house. Do not go barefoot. Bare feet are easily injured.  Check your feet daily for blisters, cuts, and redness. If you cannot see the bottom of your feet, use a mirror or ask someone for help.  Wash your feet with warm water (do not use hot water) and mild soap. Then pat your feet and the areas between your toes until they are completely dry. Do not soak your feet as this can dry your skin.  Apply a moisturizing lotion or petroleum jelly (that does not contain alcohol and is unscented) to the skin on your feet and to dry, brittle toenails. Do not apply lotion between your toes.  Trim your toenails straight across. Do not dig under them or around the cuticle. File the edges of your nails with an emery board or nail file.  Do not cut corns or calluses or try to remove them with medicine.  Wear clean socks or stockings every day. Make sure they are not too tight. Do not wear knee-high stockings since they may decrease blood flow to your legs.  Wear shoes that fit properly and have enough cushioning. To break in new shoes, wear them for just a few hours a day. This prevents you from injuring your feet. Always look in your shoes before you put them on to be sure there are no objects inside.  Do not cross your legs. This may decrease the blood flow to your feet.  If you find a minor scrape,  cut, or break in the skin on your feet, keep it and the skin around it clean and dry. These areas may be cleansed with mild soap and water. Do not cleanse the area with peroxide, alcohol, or iodine.  When you remove an adhesive bandage, be sure not to damage the skin around it.  If you have a wound, look at it several times a day to make sure it is healing.  Do not use heating pads or hot water bottles. They may burn your skin. If you have lost feeling in your feet or legs, you may not know it is happening until it is too late.  Make sure your health care provider performs a complete foot exam at least annually or more often if you have foot problems. Report any cuts, sores, or bruises to your health care provider immediately. SEEK MEDICAL CARE IF:   You have an injury that is not healing.  You have cuts or breaks in the skin.  You have an ingrown nail.  You notice redness on your legs or feet.  You feel burning or tingling in your legs or feet.  You have pain or cramps in your legs and feet.  Your legs or feet are numb.  Your feet always feel cold. SEEK IMMEDIATE MEDICAL CARE IF:   There is increasing redness,   swelling, or pain in or around a wound.  There is a red line that goes up your leg.  Pus is coming from a wound.  You develop a fever or as directed by your health care provider.  You notice a bad smell coming from an ulcer or wound.   This information is not intended to replace advice given to you by your health care provider. Make sure you discuss any questions you have with your health care provider.   Document Released: 03/01/2000 Document Revised: 11/04/2012 Document Reviewed: 08/11/2012 Elsevier Interactive Patient Education 2016 [employer]  

## 2015-08-02 ENCOUNTER — Ambulatory Visit (INDEPENDENT_AMBULATORY_CARE_PROVIDER_SITE_OTHER): Payer: BLUE CROSS/BLUE SHIELD | Admitting: Podiatry

## 2015-08-02 ENCOUNTER — Encounter: Payer: Self-pay | Admitting: Podiatry

## 2015-08-02 DIAGNOSIS — E1151 Type 2 diabetes mellitus with diabetic peripheral angiopathy without gangrene: Secondary | ICD-10-CM | POA: Diagnosis not present

## 2015-08-02 DIAGNOSIS — L84 Corns and callosities: Secondary | ICD-10-CM

## 2015-08-02 NOTE — Patient Instructions (Signed)
Diabetes and Foot Care Diabetes may cause you to have problems because of poor blood supply (circulation) to your feet and legs. This may cause the skin on your feet to become thinner, break easier, and heal more slowly. Your skin may become dry, and the skin may peel and crack. You may also have nerve damage in your legs and feet causing decreased feeling in them. You may not notice minor injuries to your feet that could lead to infections or more serious problems. Taking care of your feet is one of the most important things you can do for yourself.  HOME CARE INSTRUCTIONS  Wear shoes at all times, even in the house. Do not go barefoot. Bare feet are easily injured.  Check your feet daily for blisters, cuts, and redness. If you cannot see the bottom of your feet, use a mirror or ask someone for help.  Wash your feet with warm water (do not use hot water) and mild soap. Then pat your feet and the areas between your toes until they are completely dry. Do not soak your feet as this can dry your skin.  Apply a moisturizing lotion or petroleum jelly (that does not contain alcohol and is unscented) to the skin on your feet and to dry, brittle toenails. Do not apply lotion between your toes.  Trim your toenails straight across. Do not dig under them or around the cuticle. File the edges of your nails with an emery board or nail file.  Do not cut corns or calluses or try to remove them with medicine.  Wear clean socks or stockings every day. Make sure they are not too tight. Do not wear knee-high stockings since they may decrease blood flow to your legs.  Wear shoes that fit properly and have enough cushioning. To break in new shoes, wear them for just a few hours a day. This prevents you from injuring your feet. Always look in your shoes before you put them on to be sure there are no objects inside.  Do not cross your legs. This may decrease the blood flow to your feet.  If you find a minor scrape,  cut, or break in the skin on your feet, keep it and the skin around it clean and dry. These areas may be cleansed with mild soap and water. Do not cleanse the area with peroxide, alcohol, or iodine.  When you remove an adhesive bandage, be sure not to damage the skin around it.  If you have a wound, look at it several times a day to make sure it is healing.  Do not use heating pads or hot water bottles. They may burn your skin. If you have lost feeling in your feet or legs, you may not know it is happening until it is too late.  Make sure your health care provider performs a complete foot exam at least annually or more often if you have foot problems. Report any cuts, sores, or bruises to your health care provider immediately. SEEK MEDICAL CARE IF:   You have an injury that is not healing.  You have cuts or breaks in the skin.  You have an ingrown nail.  You notice redness on your legs or feet.  You feel burning or tingling in your legs or feet.  You have pain or cramps in your legs and feet.  Your legs or feet are numb.  Your feet always feel cold. SEEK IMMEDIATE MEDICAL CARE IF:   There is increasing redness,   swelling, or pain in or around a wound.  There is a red line that goes up your leg.  Pus is coming from a wound.  You develop a fever or as directed by your health care provider.  You notice a bad smell coming from an ulcer or wound.   This information is not intended to replace advice given to you by your health care provider. Make sure you discuss any questions you have with your health care provider.   Document Released: 03/01/2000 Document Revised: 11/04/2012 Document Reviewed: 08/11/2012 Elsevier Interactive Patient Education 2016 [employer]  

## 2015-08-02 NOTE — Progress Notes (Signed)
Patient ID: Travis Jensen, male   DOB: 09/15/1955, 60 y.o.   MRN: [id number]   Subjective: This patient presents again for scheduled visits for ongoing debridement of pre-ulcerative plantar callus on the right foot at approximately 4 month intervals. Patient also is complaining of thickened elongated toenails and requests toenail debridement  Objective: Orientated 3 patient presents with wife present in the treatment room Large well-organized plantar callus plantar left MPJ with occasional areas of punctate bleeding The toenails elongated, brittle, deformed and tender to direct palpation 6-10  Assessment: Diabetic with confirm peripheral arterial disease with evaluation by vascular Dr. Pre-ulcerative plantar callus right Symptomatic onychomycoses 6-10  Plan: Debridement toenails 6-10 mechanically an electrically without any bleeding Debrided pre-ulcerative plantar callus right without any bleeding  Reappoint 4 weeks  

## 2015-08-30 ENCOUNTER — Encounter: Payer: Self-pay | Admitting: Podiatry

## 2015-08-30 ENCOUNTER — Ambulatory Visit (INDEPENDENT_AMBULATORY_CARE_PROVIDER_SITE_OTHER): Payer: BLUE CROSS/BLUE SHIELD | Admitting: Podiatry

## 2015-08-30 DIAGNOSIS — L84 Corns and callosities: Secondary | ICD-10-CM

## 2015-08-30 DIAGNOSIS — E1151 Type 2 diabetes mellitus with diabetic peripheral angiopathy without gangrene: Secondary | ICD-10-CM

## 2015-08-30 NOTE — Patient Instructions (Signed)
Diabetes and Foot Care Diabetes may cause you to have problems because of poor blood supply (circulation) to your feet and legs. This may cause the skin on your feet to become thinner, break easier, and heal more slowly. Your skin may become dry, and the skin may peel and crack. You may also have nerve damage in your legs and feet causing decreased feeling in them. You may not notice minor injuries to your feet that could lead to infections or more serious problems. Taking care of your feet is one of the most important things you can do for yourself.  HOME CARE INSTRUCTIONS  Wear shoes at all times, even in the house. Do not go barefoot. Bare feet are easily injured.  Check your feet daily for blisters, cuts, and redness. If you cannot see the bottom of your feet, use a mirror or ask someone for help.  Wash your feet with warm water (do not use hot water) and mild soap. Then pat your feet and the areas between your toes until they are completely dry. Do not soak your feet as this can dry your skin.  Apply a moisturizing lotion or petroleum jelly (that does not contain alcohol and is unscented) to the skin on your feet and to dry, brittle toenails. Do not apply lotion between your toes.  Trim your toenails straight across. Do not dig under them or around the cuticle. File the edges of your nails with an emery board or nail file.  Do not cut corns or calluses or try to remove them with medicine.  Wear clean socks or stockings every day. Make sure they are not too tight. Do not wear knee-high stockings since they may decrease blood flow to your legs.  Wear shoes that fit properly and have enough cushioning. To break in new shoes, wear them for just a few hours a day. This prevents you from injuring your feet. Always look in your shoes before you put them on to be sure there are no objects inside.  Do not cross your legs. This may decrease the blood flow to your feet.  If you find a minor scrape,  cut, or break in the skin on your feet, keep it and the skin around it clean and dry. These areas may be cleansed with mild soap and water. Do not cleanse the area with peroxide, alcohol, or iodine.  When you remove an adhesive bandage, be sure not to damage the skin around it.  If you have a wound, look at it several times a day to make sure it is healing.  Do not use heating pads or hot water bottles. They may burn your skin. If you have lost feeling in your feet or legs, you may not know it is happening until it is too late.  Make sure your health care provider performs a complete foot exam at least annually or more often if you have foot problems. Report any cuts, sores, or bruises to your health care provider immediately. SEEK MEDICAL CARE IF:   You have an injury that is not healing.  You have cuts or breaks in the skin.  You have an ingrown nail.  You notice redness on your legs or feet.  You feel burning or tingling in your legs or feet.  You have pain or cramps in your legs and feet.  Your legs or feet are numb.  Your feet always feel cold. SEEK IMMEDIATE MEDICAL CARE IF:   There is increasing redness,   swelling, or pain in or around a wound.  There is a red line that goes up your leg.  Pus is coming from a wound.  You develop a fever or as directed by your health care provider.  You notice a bad smell coming from an ulcer or wound.   This information is not intended to replace advice given to you by your health care provider. Make sure you discuss any questions you have with your health care provider.   Document Released: 03/01/2000 Document Revised: 11/04/2012 Document Reviewed: 08/11/2012 Elsevier Interactive Patient Education 2016 [employer]  

## 2015-08-30 NOTE — Progress Notes (Signed)
Patient ID: Travis Jensen, male   DOB: 11/08/1955, 60 y.o.   MRN: [id number]   Subjective: This patient presents again for scheduled visits for ongoing debridement of pre-ulcerative plantar callus on the right foot at approximately 4 month intervals. Patient also is complaining of thickened elongated toenails and requests toenail debridement  Objective: Orientated 3 patient presents with wife present in the treatment room Large well-organized plantar callus plantar left MPJ with occasional areas of punctate bleeding The toenails elongated, brittle, deformed and tender to direct palpation 6-10  Assessment: Diabetic with confirm peripheral arterial disease with evaluation by vascular Dr. Pre-ulcerative plantar callus right Symptomatic onychomycoses 6-10  Plan: Debridement toenails 6-10 mechanically an electrically without any bleeding Debrided pre-ulcerative plantar callus right without any bleeding  Reappoint 4 weeks  

## 2015-09-27 ENCOUNTER — Encounter: Payer: Self-pay | Admitting: Podiatry

## 2015-09-27 ENCOUNTER — Ambulatory Visit (INDEPENDENT_AMBULATORY_CARE_PROVIDER_SITE_OTHER): Payer: BLUE CROSS/BLUE SHIELD | Admitting: Podiatry

## 2015-09-27 DIAGNOSIS — E1151 Type 2 diabetes mellitus with diabetic peripheral angiopathy without gangrene: Secondary | ICD-10-CM

## 2015-09-27 DIAGNOSIS — L84 Corns and callosities: Secondary | ICD-10-CM

## 2015-09-27 DIAGNOSIS — M79676 Pain in unspecified toe(s): Secondary | ICD-10-CM | POA: Diagnosis not present

## 2015-09-27 DIAGNOSIS — B351 Tinea unguium: Secondary | ICD-10-CM | POA: Diagnosis not present

## 2015-09-27 NOTE — Progress Notes (Signed)
Patient ID: Travis Jensen, male   DOB: 04/10/1955, 60 y.o.   MRN: [id number]   Subjective: This patient presents again for scheduled visits for ongoing debridement of pre-ulcerative plantar callus on the right foot at approximately 4 month intervals. Patient also is complaining of thickened elongated toenails and requests toenail debridement  Objective: Orientated 3 patient presents with wife present in the treatment room Large well-organized plantar callus plantar left MPJ with occasional areas of punctate bleeding The toenails elongated, brittle, deformed and tender to direct palpation 6-10  Assessment: Diabetic with confirm peripheral arterial disease with evaluation by vascular Dr. Pre-ulcerative plantar callus right Symptomatic onychomycoses 6-10  Plan: Debridement toenails 6-10 mechanically an electrically without any bleeding Debrided pre-ulcerative plantar callus right without any bleeding  Reappoint 4 weeks  

## 2015-09-27 NOTE — Patient Instructions (Signed)
Diabetes and Foot Care Diabetes may cause you to have problems because of poor blood supply (circulation) to your feet and legs. This may cause the skin on your feet to become thinner, break easier, and heal more slowly. Your skin may become dry, and the skin may peel and crack. You may also have nerve damage in your legs and feet causing decreased feeling in them. You may not notice minor injuries to your feet that could lead to infections or more serious problems. Taking care of your feet is one of the most important things you can do for yourself.  HOME CARE INSTRUCTIONS  Wear shoes at all times, even in the house. Do not go barefoot. Bare feet are easily injured.  Check your feet daily for blisters, cuts, and redness. If you cannot see the bottom of your feet, use a mirror or ask someone for help.  Wash your feet with warm water (do not use hot water) and mild soap. Then pat your feet and the areas between your toes until they are completely dry. Do not soak your feet as this can dry your skin.  Apply a moisturizing lotion or petroleum jelly (that does not contain alcohol and is unscented) to the skin on your feet and to dry, brittle toenails. Do not apply lotion between your toes.  Trim your toenails straight across. Do not dig under them or around the cuticle. File the edges of your nails with an emery board or nail file.  Do not cut corns or calluses or try to remove them with medicine.  Wear clean socks or stockings every day. Make sure they are not too tight. Do not wear knee-high stockings since they may decrease blood flow to your legs.  Wear shoes that fit properly and have enough cushioning. To break in new shoes, wear them for just a few hours a day. This prevents you from injuring your feet. Always look in your shoes before you put them on to be sure there are no objects inside.  Do not cross your legs. This may decrease the blood flow to your feet.  If you find a minor scrape,  cut, or break in the skin on your feet, keep it and the skin around it clean and dry. These areas may be cleansed with mild soap and water. Do not cleanse the area with peroxide, alcohol, or iodine.  When you remove an adhesive bandage, be sure not to damage the skin around it.  If you have a wound, look at it several times a day to make sure it is healing.  Do not use heating pads or hot water bottles. They may burn your skin. If you have lost feeling in your feet or legs, you may not know it is happening until it is too late.  Make sure your health care provider performs a complete foot exam at least annually or more often if you have foot problems. Report any cuts, sores, or bruises to your health care provider immediately. SEEK MEDICAL CARE IF:   You have an injury that is not healing.  You have cuts or breaks in the skin.  You have an ingrown nail.  You notice redness on your legs or feet.  You feel burning or tingling in your legs or feet.  You have pain or cramps in your legs and feet.  Your legs or feet are numb.  Your feet always feel cold. SEEK IMMEDIATE MEDICAL CARE IF:   There is increasing redness,   swelling, or pain in or around a wound.  There is a red line that goes up your leg.  Pus is coming from a wound.  You develop a fever or as directed by your health care provider.  You notice a bad smell coming from an ulcer or wound.   This information is not intended to replace advice given to you by your health care provider. Make sure you discuss any questions you have with your health care provider.   Document Released: 03/01/2000 Document Revised: 11/04/2012 Document Reviewed: 08/11/2012 Elsevier Interactive Patient Education 2016 [employer]  

## 2015-10-25 ENCOUNTER — Ambulatory Visit (INDEPENDENT_AMBULATORY_CARE_PROVIDER_SITE_OTHER): Payer: BLUE CROSS/BLUE SHIELD | Admitting: Podiatry

## 2015-10-25 ENCOUNTER — Encounter: Payer: Self-pay | Admitting: Podiatry

## 2015-10-25 DIAGNOSIS — L84 Corns and callosities: Secondary | ICD-10-CM

## 2015-10-25 DIAGNOSIS — M79676 Pain in unspecified toe(s): Secondary | ICD-10-CM | POA: Diagnosis not present

## 2015-10-25 DIAGNOSIS — B351 Tinea unguium: Secondary | ICD-10-CM

## 2015-10-25 DIAGNOSIS — E1151 Type 2 diabetes mellitus with diabetic peripheral angiopathy without gangrene: Secondary | ICD-10-CM | POA: Diagnosis not present

## 2015-10-25 NOTE — Patient Instructions (Signed)
Diabetes and Foot Care Diabetes may cause you to have problems because of poor blood supply (circulation) to your feet and legs. This may cause the skin on your feet to become thinner, break easier, and heal more slowly. Your skin may become dry, and the skin may peel and crack. You may also have nerve damage in your legs and feet causing decreased feeling in them. You may not notice minor injuries to your feet that could lead to infections or more serious problems. Taking care of your feet is one of the most important things you can do for yourself.  HOME CARE INSTRUCTIONS  Wear shoes at all times, even in the house. Do not go barefoot. Bare feet are easily injured.  Check your feet daily for blisters, cuts, and redness. If you cannot see the bottom of your feet, use a mirror or ask someone for help.  Wash your feet with warm water (do not use hot water) and mild soap. Then pat your feet and the areas between your toes until they are completely dry. Do not soak your feet as this can dry your skin.  Apply a moisturizing lotion or petroleum jelly (that does not contain alcohol and is unscented) to the skin on your feet and to dry, brittle toenails. Do not apply lotion between your toes.  Trim your toenails straight across. Do not dig under them or around the cuticle. File the edges of your nails with an emery board or nail file.  Do not cut corns or calluses or try to remove them with medicine.  Wear clean socks or stockings every day. Make sure they are not too tight. Do not wear knee-high stockings since they may decrease blood flow to your legs.  Wear shoes that fit properly and have enough cushioning. To break in new shoes, wear them for just a few hours a day. This prevents you from injuring your feet. Always look in your shoes before you put them on to be sure there are no objects inside.  Do not cross your legs. This may decrease the blood flow to your feet.  If you find a minor scrape,  cut, or break in the skin on your feet, keep it and the skin around it clean and dry. These areas may be cleansed with mild soap and water. Do not cleanse the area with peroxide, alcohol, or iodine.  When you remove an adhesive bandage, be sure not to damage the skin around it.  If you have a wound, look at it several times a day to make sure it is healing.  Do not use heating pads or hot water bottles. They may burn your skin. If you have lost feeling in your feet or legs, you may not know it is happening until it is too late.  Make sure your health care provider performs a complete foot exam at least annually or more often if you have foot problems. Report any cuts, sores, or bruises to your health care provider immediately. SEEK MEDICAL CARE IF:   You have an injury that is not healing.  You have cuts or breaks in the skin.  You have an ingrown nail.  You notice redness on your legs or feet.  You feel burning or tingling in your legs or feet.  You have pain or cramps in your legs and feet.  Your legs or feet are numb.  Your feet always feel cold. SEEK IMMEDIATE MEDICAL CARE IF:   There is increasing redness,   swelling, or pain in or around a wound.  There is a red line that goes up your leg.  Pus is coming from a wound.  You develop a fever or as directed by your health care provider.  You notice a bad smell coming from an ulcer or wound.   This information is not intended to replace advice given to you by your health care provider. Make sure you discuss any questions you have with your health care provider.   Document Released: 03/01/2000 Document Revised: 11/04/2012 Document Reviewed: 08/11/2012 Elsevier Interactive Patient Education 2016 [employer]  

## 2015-10-25 NOTE — Progress Notes (Signed)
Patient ID: Travis Jensen, male   DOB: [birth year], 60 y.o.   MRN: [id number]   Subjective: This patient presents again for scheduled visits for ongoing debridement of pre-ulcerative plantar callus on the right foot at approximately 4 month intervals. Patient also is complaining of thickened elongated toenails and requests toenail debridement  Objective: Orientated 3 patient presents with wife present in the treatment room Large well-organized plantar callus plantar left MPJ with occasional areas of punctate bleeding The toenails elongated, brittle, deformed and tender to direct palpation 6-10  Assessment: Diabetic with confirm peripheral arterial disease with evaluation by vascular Dr. Lenn SinkPre-ulcerative plantar callus right Symptomatic onychomycoses 6-10  Plan: Debridement toenails 6-10 mechanically an electrically without any bleeding Debrided pre-ulcerative plantar callus right without any bleeding  Reappoint 4 weeks

## 2015-11-22 ENCOUNTER — Ambulatory Visit (INDEPENDENT_AMBULATORY_CARE_PROVIDER_SITE_OTHER): Payer: BLUE CROSS/BLUE SHIELD | Admitting: Podiatry

## 2015-11-22 ENCOUNTER — Encounter: Payer: Self-pay | Admitting: Podiatry

## 2015-11-22 DIAGNOSIS — B351 Tinea unguium: Secondary | ICD-10-CM | POA: Diagnosis not present

## 2015-11-22 DIAGNOSIS — I739 Peripheral vascular disease, unspecified: Secondary | ICD-10-CM

## 2015-11-22 DIAGNOSIS — L84 Corns and callosities: Secondary | ICD-10-CM

## 2015-11-22 DIAGNOSIS — E1151 Type 2 diabetes mellitus with diabetic peripheral angiopathy without gangrene: Secondary | ICD-10-CM

## 2015-11-22 NOTE — Patient Instructions (Signed)
Diabetes and Foot Care Diabetes may cause you to have problems because of poor blood supply (circulation) to your feet and legs. This may cause the skin on your feet to become thinner, break easier, and heal more slowly. Your skin may become dry, and the skin may peel and crack. You may also have nerve damage in your legs and feet causing decreased feeling in them. You may not notice minor injuries to your feet that could lead to infections or more serious problems. Taking care of your feet is one of the most important things you can do for yourself.  HOME CARE INSTRUCTIONS  Wear shoes at all times, even in the house. Do not go barefoot. Bare feet are easily injured.  Check your feet daily for blisters, cuts, and redness. If you cannot see the bottom of your feet, use a mirror or ask someone for help.  Wash your feet with warm water (do not use hot water) and mild soap. Then pat your feet and the areas between your toes until they are completely dry. Do not soak your feet as this can dry your skin.  Apply a moisturizing lotion or petroleum jelly (that does not contain alcohol and is unscented) to the skin on your feet and to dry, brittle toenails. Do not apply lotion between your toes.  Trim your toenails straight across. Do not dig under them or around the cuticle. File the edges of your nails with an emery board or nail file.  Do not cut corns or calluses or try to remove them with medicine.  Wear clean socks or stockings every day. Make sure they are not too tight. Do not wear knee-high stockings since they may decrease blood flow to your legs.  Wear shoes that fit properly and have enough cushioning. To break in new shoes, wear them for just a few hours a day. This prevents you from injuring your feet. Always look in your shoes before you put them on to be sure there are no objects inside.  Do not cross your legs. This may decrease the blood flow to your feet.  If you find a minor scrape,  cut, or break in the skin on your feet, keep it and the skin around it clean and dry. These areas may be cleansed with mild soap and water. Do not cleanse the area with peroxide, alcohol, or iodine.  When you remove an adhesive bandage, be sure not to damage the skin around it.  If you have a wound, look at it several times a day to make sure it is healing.  Do not use heating pads or hot water bottles. They may burn your skin. If you have lost feeling in your feet or legs, you may not know it is happening until it is too late.  Make sure your health care provider performs a complete foot exam at least annually or more often if you have foot problems. Report any cuts, sores, or bruises to your health care provider immediately. SEEK MEDICAL CARE IF:   You have an injury that is not healing.  You have cuts or breaks in the skin.  You have an ingrown nail.  You notice redness on your legs or feet.  You feel burning or tingling in your legs or feet.  You have pain or cramps in your legs and feet.  Your legs or feet are numb.  Your feet always feel cold. SEEK IMMEDIATE MEDICAL CARE IF:   There is increasing redness,   swelling, or pain in or around a wound.  There is a red line that goes up your leg.  Pus is coming from a wound.  You develop a fever or as directed by your health care provider.  You notice a bad smell coming from an ulcer or wound.   This information is not intended to replace advice given to you by your health care provider. Make sure you discuss any questions you have with your health care provider.   Document Released: 03/01/2000 Document Revised: 11/04/2012 Document Reviewed: 08/11/2012 Elsevier Interactive Patient Education 2016 [employer]  

## 2015-11-23 NOTE — Progress Notes (Signed)
Patient ID: Travis Jensen, male   DOB: 1956-03-18, 60 y.o.   MRN: [id number]    Subjective: This patient presents again for scheduled visits for ongoing debridement of pre-ulcerative plantar callus on the right foot at approximately 4 month intervals. Patient also is complaining of thickened elongated toenails and requests toenail debridement [place] [facility] [place] in the next several days for work and will not return for follow-up in our office after the visit today  Objective: Orientated 3 patient presents with wife present in the treatment room Large well-organized plantar callus plantar left MPJ with occasional areas of punctate bleeding The toenails elongated, brittle, deformed and tender to direct palpation 6-10  Assessment: Diabetic with confirm peripheral arterial disease with evaluation by vascular Dr. Lenn SinkPre-ulcerative plantar callus right Symptomatic onychomycoses 6-10  Plan: Debridement toenails 6-10 mechanically an electrically without any bleeding Debrided pre-ulcerative plantar callus right without any bleeding  Patient leaving [place] and will not rescheduled for follow-up

## 2015-11-29 ENCOUNTER — Ambulatory Visit: Payer: BLUE CROSS/BLUE SHIELD | Admitting: Podiatry

## 2016-02-02 IMAGING — MR MR ABDOMEN WO/W CM
18 series · 48 of 48 positions shown · IV contrast (multihance)
Comparison: Abdominal ultrasound 08/20/2013. Abdominal MRI
10/22/2010.

CLINICAL DATA: Abdominal pain.

EXAM:
MRI ABDOMEN WITHOUT AND WITH CONTRAST
TECHNIQUE: Multiplanar multisequence MR imaging of the abdomen was performed
both before and after the administration of intravenous contrast.
CONTRAST:  10mL MULTIHANCE GADOBENATE DIMEGLUMINE 529 MG/ML IV SOLN

[Series 4: T2 · coronal · 5.0mm · 0.78mm/px · 1 of 19 slices shown (1 of 4)]
[im 1/19]
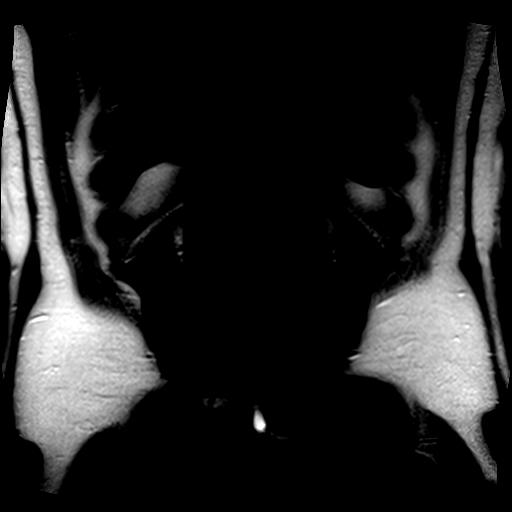

[Series 5: T2 · axial · 5.0mm · 0.66mm/px · 1 of 23 slices shown (2 of 4)]
[im 1/23]
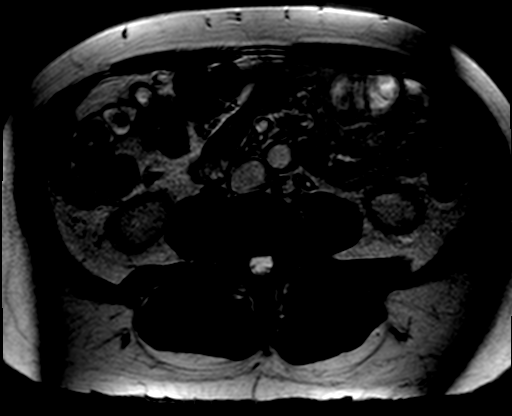

[Series 6: T2 · axial · 5.0mm · 0.70mm/px · 1 of 23 slices shown (3 of 4)]
[im 1/23]
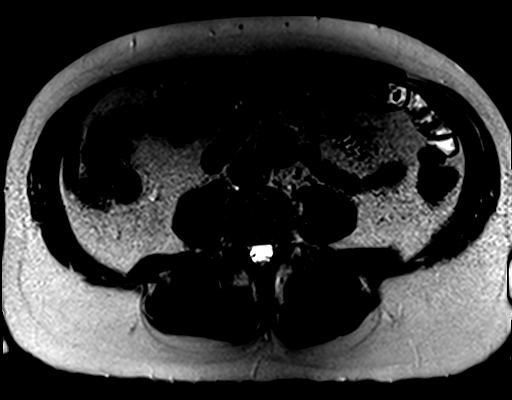

[Series 7: [id number]_trig · axial · 5.0mm · 1.88mm/px · z∈[-89,+67]mm · 3 of 81 slices shown]
[im 1/81]
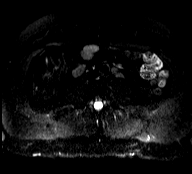
[im 41/81]
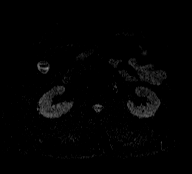
[im 81/81]
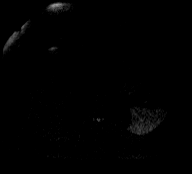

[Series 8: [id number]_trig_adc · axial · 5.0mm · 1.88mm/px · 1 of 27 slices shown]
[im 1/27]
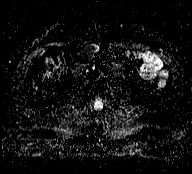

[Series 9: T2 · axial · 5.0mm · 1.41mm/px · 1 of 27 slices shown (4 of 4)]
[im 1/27]
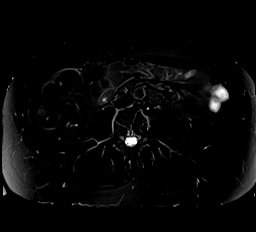

[Series 10: T1 · axial · 5.0mm · 0.70mm/px · 1 of 46 slices shown]
[im 1/46]
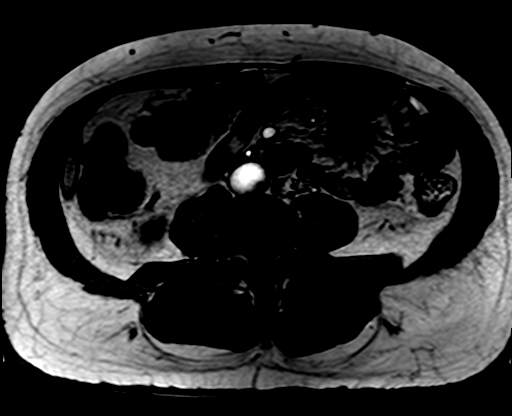

[Series 11: bSSFP · coronal · 5.0mm · 0.70mm/px · 1 of 19 slices shown]
[im 1/19]
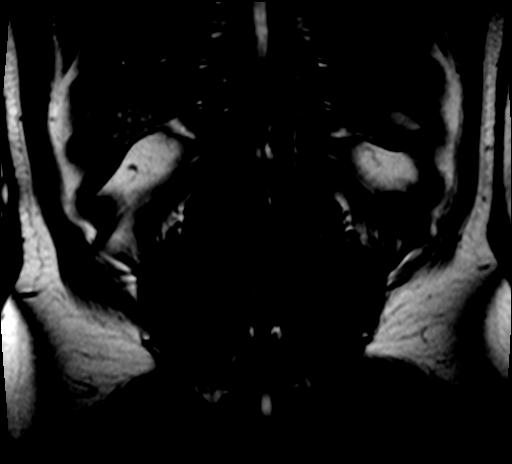

[Series 12: T1 dynamic · axial · non-contrast · 2.3mm · 1.41mm/px · z∈[-110,+53]mm · 3 of 72 slices shown]
[im 1/72]
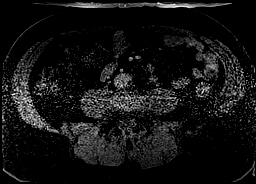
[im 36/72]
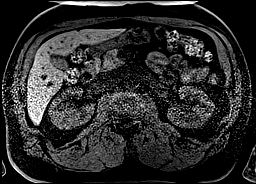
[im 72/72]
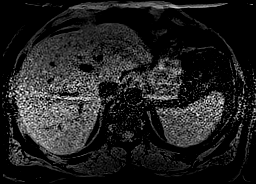

[Series 13: post 25 sec · axial · 2.3mm · 1.41mm/px · z∈[-110,+53]mm · 4 of 72 slices shown]
[im 1/72]
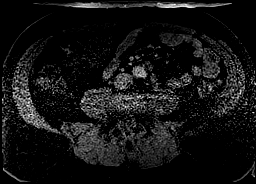
[im 24/72]
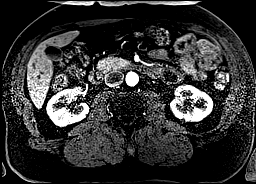
[im 48/72]
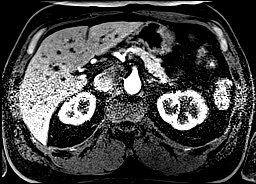
[im 72/72]
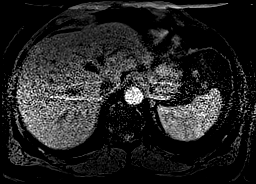

[Series 14: post 25 sec_sub · axial · 2.3mm · 1.41mm/px · z∈[-110,+53]mm · 4 of 72 slices shown]
[im 1/72]
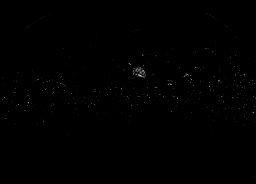
[im 24/72]
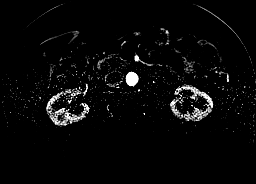
[im 48/72]
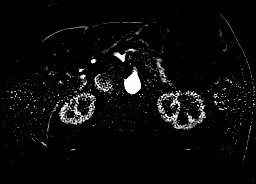
[im 72/72]
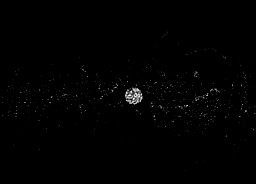

[Series 15: post 45 sec · axial · 2.3mm · 1.41mm/px · z∈[-110,+53]mm · 4 of 72 slices shown]
[im 1/72]
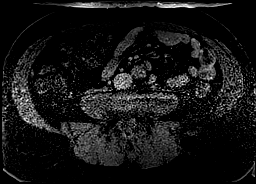
[im 24/72]
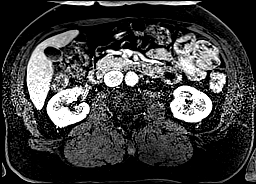
[im 48/72]
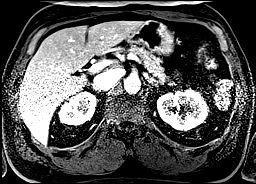
[im 72/72]
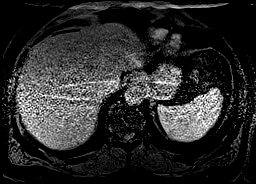

[Series 16: post 45 sec_sub · axial · 2.3mm · 1.41mm/px · z∈[-110,+53]mm · 4 of 72 slices shown]
[im 1/72]
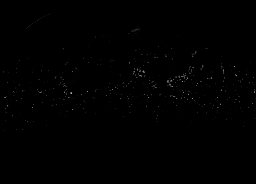
[im 24/72]
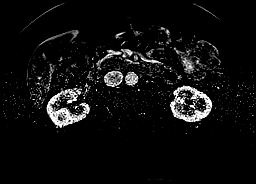
[im 48/72]
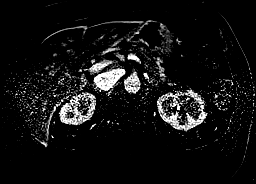
[im 72/72]
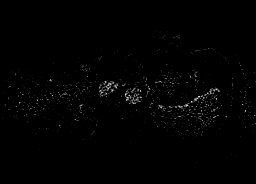

[Series 17: post 90 sec · axial · 2.3mm · 1.41mm/px · z∈[-110,+53]mm · 4 of 72 slices shown]
[im 1/72]
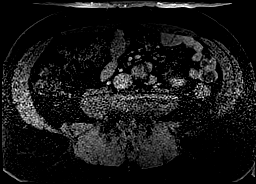
[im 24/72]
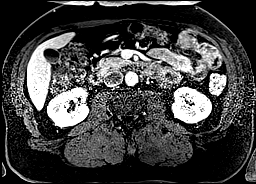
[im 48/72]
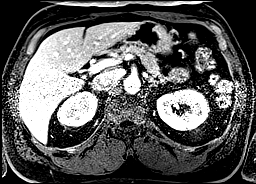
[im 72/72]
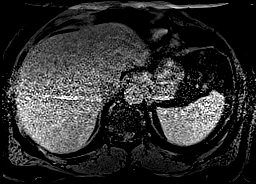

[Series 18: post 90 sec_sub · axial · 2.3mm · 1.41mm/px · z∈[-110,+53]mm · 4 of 72 slices shown]
[im 1/72]
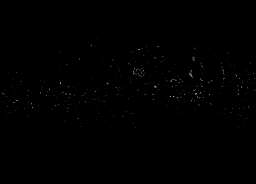
[im 24/72]
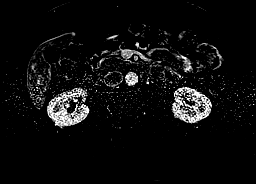
[im 48/72]
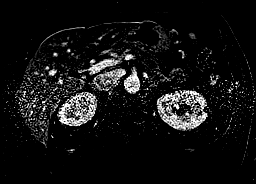
[im 72/72]
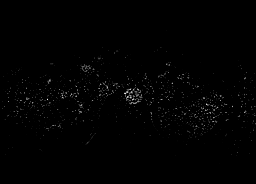

[Series 19: T1 dynamic post-contrast · coronal · 2.0mm · 0.74mm/px · 3 of 60 slices shown]
[im 1/60]
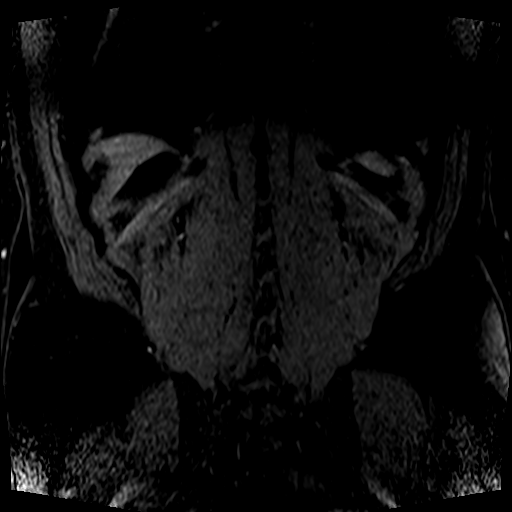
[im 30/60]
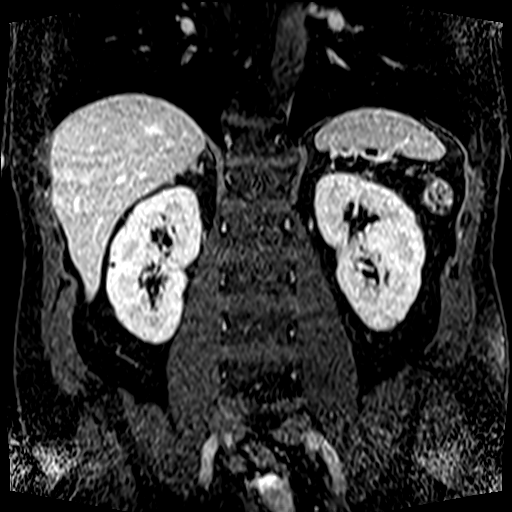
[im 60/60]
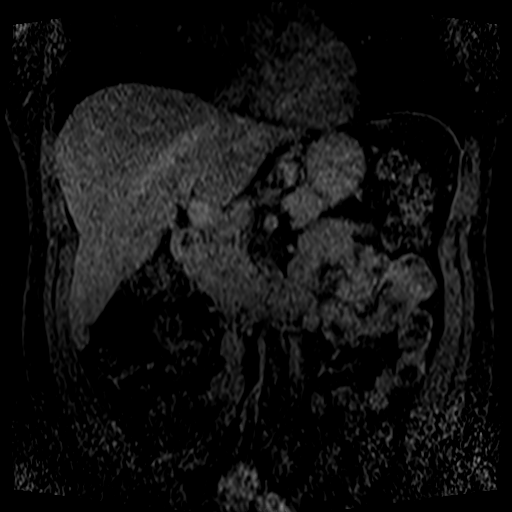

[Series 20: post axial 3+ · axial · 2.3mm · 1.41mm/px · z∈[-110,+53]mm · 4 of 72 slices shown (1 of 2)]
[im 1/72]
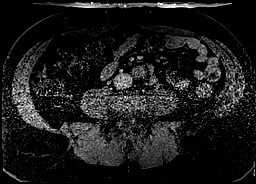
[im 24/72]
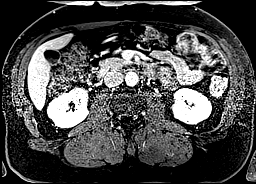
[im 48/72]
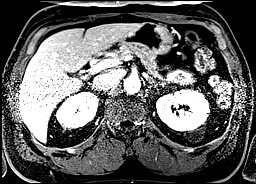
[im 72/72]
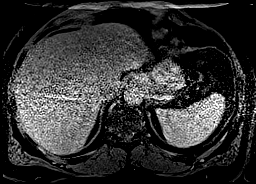

[Series 21: post axial 3+ · axial · 2.3mm · 1.41mm/px · z∈[-110,+53]mm · 4 of 72 slices shown (2 of 2)]
[im 1/72]
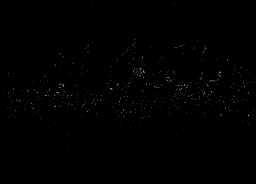
[im 24/72]
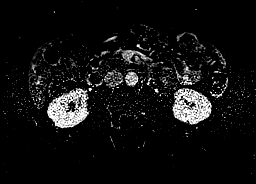
[im 48/72]
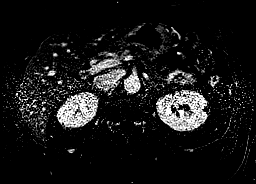
[im 72/72]
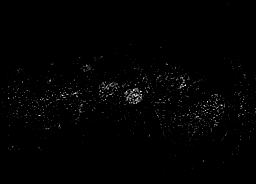

[48 of 48 positions shown; findings below may reference images not displayed]

FINDINGS: This study is slightly limited by misregistration between pre and
postcontrast T1 weighted images. This limits the utility of
subtraction images on today's examination.

Multiple renal lesions are noted within the kidneys bilaterally. The
majority of these are subcentimeter in size and are low T1 signal
intensity, high T2 signal intensity, and do not enhance, compatible
with tiny simple cysts. The largest simple cyst measures 1.6 cm in
the medial aspect of the upper pole of the right kidney. In
addition, extending exophytically off the posterior aspect of the
interpolar region of the left kidney there is a 3.3 x 2.5 x 3.0 cm
lesion that is generally T1 hypointense and T2 hyperintense, without
internal enhancement, however, there is some dependent T1
hyperintensity and T2 hypointensity on precontrast images,
compatible with proteinaceous or hemorrhagic debris. In addition, in
the interpolar region of the left kidney there is a 9 mm T1
hyperintense and T2 hyperintense lesion, which does not demonstrate
definitive enhancement on post gadolinium images, compatible with a
mildly proteinaceous or hemorrhagic cyst. Also, extending
exophytically off the lower pole of the left kidney there is a
cm lesion that is T2 hypointense, T1 hyperintense, and does not
enhance, compatible with another more significantly proteinaceous or
hemorrhagic cyst.

The appearance of the visualized portions of the liver, gallbladder,
spleen and bilateral adrenal glands are unremarkable. In the
proximal body of the pancreas there is an 8 mm lesion that is low
signal intensity on T1 weighted images, high signal intensity on T2
weighted images, and does not enhance. In retrospect, this lesion is
unchanged in size and appearance compared to prior studies dating
back to 04/25/2009, presumably benign (likely a tiny pancreatic
pseudocyst).
IMPRESSION: 1. Multiple renal lesions appear similar to prior examinations, as
detailed above. This includes a very proteinaceous or hemorrhagic
exophytic lower pole cyst in the left kidney measuring 13 mm, a more
mildly proteinaceous or hemorrhagic cyst in the lateral aspect of
the interpolar region of the left kidney measuring 9 mm, and a 3.3 x
2.5 x 3.0 cm exophytic lesion in the posterior aspect of the
interpolar region of the left kidney which contains some hemorrhagic
or proteinaceous debris. No suspicious lesions are noted.

## 2016-11-05 ENCOUNTER — Telehealth: Payer: Self-pay | Admitting: Podiatry

## 2016-11-05 ENCOUNTER — Telehealth: Payer: Self-pay | Admitting: *Deleted

## 2016-11-05 NOTE — Telephone Encounter (Signed)
I'm calling to make an appointment with Dr. Leeanne Deed tomorrow. Someone was supposed to call me back but they haven't called me back yet. Its an emergency that I see Dr. Leeanne Deed tomorrow.

## 2016-11-05 NOTE — Telephone Encounter (Signed)
Pt states has history of being pt and needs to see Dr. Leeanne Deed, wants to know if we could draft the money out of his account on Friday. Pt is scheduled for 11/06/2016.

## 2016-11-06 ENCOUNTER — Encounter: Payer: Self-pay | Admitting: Podiatry

## 2016-11-06 ENCOUNTER — Ambulatory Visit (INDEPENDENT_AMBULATORY_CARE_PROVIDER_SITE_OTHER): Payer: Self-pay | Admitting: Podiatry

## 2016-11-06 DIAGNOSIS — B351 Tinea unguium: Secondary | ICD-10-CM

## 2016-11-06 DIAGNOSIS — E1151 Type 2 diabetes mellitus with diabetic peripheral angiopathy without gangrene: Secondary | ICD-10-CM

## 2016-11-06 DIAGNOSIS — L84 Corns and callosities: Secondary | ICD-10-CM

## 2016-11-06 NOTE — Progress Notes (Signed)
Patient ID: Travis Jensen, male   DOB: 05-13-1955, 61 y.o.   MRN: [id number]   Subjective: This patient presents today complaining that his toenails are extremely thickened and elongated uncomfortable walking wearing shoes. Patient's last visit for similar service was on 11/22/2015. At that time patient had relocated from [facility] to [facility] states that is not any podiatric care since our visit in 2017 Patient is a known diabetic with a history of peripheral arterial disease  Objective: Orientated 3 DP and PT pulses nonpalpable bilaterally Capillary reflex delay bilaterally Sensation to 10 g monofilament wire intact 5/5 bilaterally Vibratory sensation reactive bilaterally Ankle reflex reactive bilaterally No open skin lesions bilaterally Absent hair growth bilaterally Toenails extremely elongated, brittle, deformed with maximum deformity and hallux toenails, 6-10 Bleeding callus plantar subsecond third MPJ left Dorsi flexion, plantar flexion 5/5 bilaterally  Assessment: Diabetic with peripheral arterial disease Neglected symptomatic onychomycosis 6-10 Pre-ulcerative plantar callus left  Plan: Debridement toenails 6-10 mechanical electrically with slight bleeding distal fourth left toe treated with topical antibiotic ointment and Band-Aid. Patient instruction removed Band-Aid 1-3 days and continue apply topical antibiotic ointment and Band-Aid daily until a scab forms Debride plantar callus left without any bleeding  Patient return visit recommended at three-month intervals here in our office or podiatrist in [place], [place]  Patient states will contact her office for follow-up care

## 2016-11-06 NOTE — Patient Instructions (Signed)
Mood and Band-Aid on the fourth left toe and 1-3 days and apply topical antibiotic ointment and Band-Aid daily until a scab forms  Diabetes and Foot Care Diabetes may cause you to have problems because of poor blood supply (circulation) to your feet and legs. This may cause the skin on your feet to become thinner, break easier, and heal more slowly. Your skin may become dry, and the skin may peel and crack. You may also have nerve damage in your legs and feet causing decreased feeling in them. You may not notice minor injuries to your feet that could lead to infections or more serious problems. Taking care of your feet is one of the most important things you can do for yourself. Follow these instructions at home:  Wear shoes at all times, even in the house. Do not go barefoot. Bare feet are easily injured.  Check your feet daily for blisters, cuts, and redness. If you cannot see the bottom of your feet, use a mirror or ask someone for help.  Wash your feet with warm water (do not use hot water) and mild soap. Then pat your feet and the areas between your toes until they are completely dry. Do not soak your feet as this can dry your skin.  Apply a moisturizing lotion or petroleum jelly (that does not contain alcohol and is unscented) to the skin on your feet and to dry, brittle toenails. Do not apply lotion between your toes.  Trim your toenails straight across. Do not dig under them or around the cuticle. File the edges of your nails with an emery board or nail file.  Do not cut corns or calluses or try to remove them with medicine.  Wear clean socks or stockings every day. Make sure they are not too tight. Do not wear knee-high stockings since they may decrease blood flow to your legs.  Wear shoes that fit properly and have enough cushioning. To break in new shoes, wear them for just a few hours a day. This prevents you from injuring your feet. Always look in your shoes before you put them on  to be sure there are no objects inside.  Do not cross your legs. This may decrease the blood flow to your feet.  If you find a minor scrape, cut, or break in the skin on your feet, keep it and the skin around it clean and dry. These areas may be cleansed with mild soap and water. Do not cleanse the area with peroxide, alcohol, or iodine.  When you remove an adhesive bandage, be sure not to damage the skin around it.  If you have a wound, look at it several times a day to make sure it is healing.  Do not use heating pads or hot water bottles. They may burn your skin. If you have lost feeling in your feet or legs, you may not know it is happening until it is too late.  Make sure your health care provider performs a complete foot exam at least annually or more often if you have foot problems. Report any cuts, sores, or bruises to your health care provider immediately. Contact a health care provider if:  You have an injury that is not healing.  You have cuts or breaks in the skin.  You have an ingrown nail.  You notice redness on your legs or feet.  You feel burning or tingling in your legs or feet.  You have pain or cramps in your legs  and feet.  Your legs or feet are numb.  Your feet always feel cold. Get help right away if:  There is increasing redness, swelling, or pain in or around a wound.  There is a red line that goes up your leg.  Pus is coming from a wound.  You develop a fever or as directed by your health care provider.  You notice a bad smell coming from an ulcer or wound. This information is not intended to replace advice given to you by your health care provider. Make sure you discuss any questions you have with your health care provider. Document Released: 03/01/2000 Document Revised: 08/10/2015 Document Reviewed: 08/11/2012 Elsevier Interactive Patient Education  2017 [employer].
